# Patient Record
Sex: Female | Born: 1945
Health system: Southern US, Community
[De-identification: ages and names within clinical notes are randomized; demographics above are authoritative.]

## PROBLEM LIST (undated history)

## (undated) DIAGNOSIS — H269 Unspecified cataract: Secondary | ICD-10-CM

## (undated) DIAGNOSIS — E785 Hyperlipidemia, unspecified: Secondary | ICD-10-CM

## (undated) DIAGNOSIS — R739 Hyperglycemia, unspecified: Secondary | ICD-10-CM

## (undated) DIAGNOSIS — K589 Irritable bowel syndrome without diarrhea: Secondary | ICD-10-CM

## (undated) DIAGNOSIS — R42 Dizziness and giddiness: Secondary | ICD-10-CM

## (undated) DIAGNOSIS — K5792 Diverticulitis of intestine, part unspecified, without perforation or abscess without bleeding: Secondary | ICD-10-CM

## (undated) DIAGNOSIS — H9191 Unspecified hearing loss, right ear: Secondary | ICD-10-CM

## (undated) DIAGNOSIS — M1990 Unspecified osteoarthritis, unspecified site: Secondary | ICD-10-CM

## (undated) DIAGNOSIS — G459 Transient cerebral ischemic attack, unspecified: Secondary | ICD-10-CM

## (undated) DIAGNOSIS — I480 Paroxysmal atrial fibrillation: Secondary | ICD-10-CM

## (undated) HISTORY — DX: Diverticulitis of intestine, part unspecified, without perforation or abscess without bleeding: K57.92

## (undated) HISTORY — PX: MIDDLE EAR SURGERY: SHX713

## (undated) HISTORY — DX: Hyperglycemia, unspecified: R73.9

## (undated) HISTORY — PX: BREAST CYST ASPIRATION: SHX578

## (undated) HISTORY — DX: Irritable bowel syndrome, unspecified: K58.9

## (undated) HISTORY — DX: Hyperlipidemia, unspecified: E78.5

## (undated) HISTORY — DX: Unspecified hearing loss, right ear: H91.91

## (undated) HISTORY — DX: Unspecified osteoarthritis, unspecified site: M19.90

## (undated) HISTORY — DX: Transient cerebral ischemic attack, unspecified: G45.9

## (undated) HISTORY — PX: CHOLECYSTECTOMY: SHX55

## (undated) HISTORY — PX: TONSILLECTOMY: SUR1361

## (undated) HISTORY — DX: Unspecified cataract: H26.9

## (undated) HISTORY — DX: Dizziness and giddiness: R42

## (undated) HISTORY — DX: Paroxysmal atrial fibrillation: I48.0

## (undated) HISTORY — PX: BREAST BIOPSY: SHX20

## (undated) HISTORY — PX: APPENDECTOMY: SHX54

---

## 2011-09-27 ENCOUNTER — Encounter (HOSPITAL_BASED_OUTPATIENT_CLINIC_OR_DEPARTMENT_OTHER): Payer: Self-pay | Admitting: Emergency Medicine

## 2011-09-27 ENCOUNTER — Emergency Department (HOSPITAL_BASED_OUTPATIENT_CLINIC_OR_DEPARTMENT_OTHER)
Admission: EM | Admit: 2011-09-27 | Discharge: 2011-09-27 | Disposition: A | Discharge status: Home / Self Care | Payer: BC Managed Care – PPO | Attending: Emergency Medicine | Admitting: Emergency Medicine

## 2011-09-27 ENCOUNTER — Emergency Department (INDEPENDENT_AMBULATORY_CARE_PROVIDER_SITE_OTHER): Payer: BC Managed Care – PPO

## 2011-09-27 DIAGNOSIS — R197 Diarrhea, unspecified: Secondary | ICD-10-CM

## 2011-09-27 DIAGNOSIS — R109 Unspecified abdominal pain: Secondary | ICD-10-CM | POA: Insufficient documentation

## 2011-09-27 DIAGNOSIS — N2889 Other specified disorders of kidney and ureter: Secondary | ICD-10-CM

## 2011-09-27 DIAGNOSIS — R935 Abnormal findings on diagnostic imaging of other abdominal regions, including retroperitoneum: Secondary | ICD-10-CM

## 2011-09-27 DIAGNOSIS — R110 Nausea: Secondary | ICD-10-CM | POA: Insufficient documentation

## 2011-09-27 DIAGNOSIS — K5732 Diverticulitis of large intestine without perforation or abscess without bleeding: Secondary | ICD-10-CM | POA: Insufficient documentation

## 2011-09-27 DIAGNOSIS — K5792 Diverticulitis of intestine, part unspecified, without perforation or abscess without bleeding: Secondary | ICD-10-CM

## 2011-09-27 LAB — COMPREHENSIVE METABOLIC PANEL
BUN: 15 mg/dL (ref 6–23)
CO2: 24 mEq/L (ref 19–32)
Calcium: 10 mg/dL (ref 8.4–10.5)
Chloride: 103 mEq/L (ref 96–112)
Creatinine, Ser: 0.7 mg/dL (ref 0.50–1.10)
GFR calc Af Amer: >90 mL/min (ref >90)
GFR calc non Af Amer: 90 mL/min — ABNORMAL LOW (ref >90)
Glucose, Bld: 99 mg/dL (ref 70–99)
Total Bilirubin: 0.6 mg/dL (ref 0.3–1.2)

## 2011-09-27 LAB — URINALYSIS, ROUTINE W REFLEX MICROSCOPIC
Glucose, UA: NEGATIVE mg/dL
Ketones, ur: NEGATIVE mg/dL
Protein, ur: NEGATIVE mg/dL
pH: 5 (ref 5.0–8.0)

## 2011-09-27 LAB — DIFFERENTIAL
Basophils Absolute: 0 10*3/uL (ref 0.0–0.1)
Eosinophils Relative: 1 % (ref 0–5)
Lymphocytes Relative: 20 % (ref 12–46)
Monocytes Absolute: 1.7 10*3/uL — ABNORMAL HIGH (ref 0.1–1.0)
Monocytes Relative: 15 % — ABNORMAL HIGH (ref 3–12)
Neutro Abs: 7.3 10*3/uL (ref 1.7–7.7)

## 2011-09-27 LAB — CBC
HCT: 42.8 % (ref 36.0–46.0)
Hemoglobin: 14.7 g/dL (ref 12.0–15.0)
MCV: 90.5 fL (ref 78.0–100.0)
RDW: 13 % (ref 11.5–15.5)
WBC: 11.4 10*3/uL — ABNORMAL HIGH (ref 4.0–10.5)

## 2011-09-27 LAB — URINE MICROSCOPIC-ADD ON

## 2011-09-27 MED ORDER — CIPROFLOXACIN HCL 500 MG PO TABS: 500.0000 mg | ORAL_TABLET | Freq: Two times a day (BID) | ORAL | Status: AC

## 2011-09-27 MED ORDER — CIPROFLOXACIN IN D5W 400 MG/200ML IV SOLN
400.0000 mg | Freq: Once | INTRAVENOUS | Status: AC
  Administered 2011-09-27: 400 mg via INTRAVENOUS
  Filled 2011-09-27: qty 200

## 2011-09-27 MED ORDER — ONDANSETRON HCL 4 MG/2ML IJ SOLN
4.0000 mg | Freq: Once | INTRAMUSCULAR | Status: AC
  Administered 2011-09-27: 4 mg via INTRAVENOUS
  Filled 2011-09-27: qty 2

## 2011-09-27 MED ORDER — IOHEXOL 300 MG/ML  SOLN
100.0000 mL | Freq: Once | INTRAMUSCULAR | Status: AC | PRN
  Administered 2011-09-27: 100 mL via INTRAVENOUS

## 2011-09-27 MED ORDER — METRONIDAZOLE IN NACL 5-0.79 MG/ML-% IV SOLN
500.0000 mg | Freq: Once | INTRAVENOUS | Status: AC
  Administered 2011-09-27: 500 mg via INTRAVENOUS
  Filled 2011-09-27: qty 100

## 2011-09-27 MED ORDER — HYDROCODONE-ACETAMINOPHEN 5-500 MG PO TABS: 1.0000 | ORAL_TABLET | Freq: Four times a day (QID) | ORAL | Status: AC | PRN

## 2011-09-27 MED ORDER — MORPHINE SULFATE 4 MG/ML IJ SOLN
4.0000 mg | Freq: Once | INTRAMUSCULAR | Status: AC
  Administered 2011-09-27: 4 mg via INTRAVENOUS
  Filled 2011-09-27: qty 1

## 2011-09-27 MED ORDER — IOHEXOL 300 MG/ML  SOLN
20.0000 mL | INTRAMUSCULAR | Status: AC
  Administered 2011-09-27 (×2): 20 mL via ORAL

## 2011-09-27 MED ORDER — METRONIDAZOLE 500 MG PO TABS: 500.0000 mg | ORAL_TABLET | Freq: Two times a day (BID) | ORAL | Status: AC

## 2011-09-27 MED ORDER — SODIUM CHLORIDE 0.9 % IV SOLN
999.0000 mL | INTRAVENOUS | Status: DC
  Administered 2011-09-27: 999 mL via INTRAVENOUS

## 2011-09-27 MED ORDER — SODIUM CHLORIDE 0.9 % IV SOLN
Freq: Once | INTRAVENOUS | Status: AC
  Administered 2011-09-27: 1000 mL via INTRAVENOUS

## 2011-09-27 NOTE — ED Notes (Signed)
Pt c/o BLQ pain, nausea, some diarrhea x 1 wk; has been on a bland diet this week, but no improvement

## 2011-09-27 NOTE — ED Provider Notes (Signed)
History     CSN: [id number]  Arrival date & time 09/27/11  1114   First MD Initiated Contact with Patient 09/27/11 1215      Chief Complaint  Patient presents with  . Abdominal Pain    (Consider location/radiation/quality/duration/timing/severity/associated sxs/prior treatment) Patient is a 66 y.o. female presenting with abdominal pain. The history is provided by the patient.  Abdominal Pain The primary symptoms of the illness include abdominal pain, nausea and diarrhea. The primary symptoms of the illness do not include fever, vomiting or dysuria. The current episode started more than 2 days ago. The onset of the illness was sudden. The problem has not changed since onset. The patient states that she believes she is currently not pregnant. Risk factors for an acute abdominal problem include being elderly. Symptoms associated with the illness do not include constipation or urgency.    History reviewed. No pertinent past medical history.  Past Surgical History  Procedure Date  . Cholecystectomy   . Appendectomy   . Tonsillectomy   . Middle ear surgery     No family history on file.  History  Substance Use Topics  . Smoking status: Former Games developer  . Smokeless tobacco: Not on file  . Alcohol Use: Yes     social    OB History    Grav Para Term Preterm Abortions TAB SAB Ect Mult Living                  Review of Systems  Constitutional: Negative for fever.  Gastrointestinal: Positive for nausea, abdominal pain and diarrhea. Negative for vomiting and constipation.  Genitourinary: Negative for dysuria and urgency.  All other systems reviewed and are negative.    Allergies  Review of patient's allergies indicates no known allergies.  Home Medications   Current Outpatient Rx  Name Route Sig Dispense Refill  . NORCO PO Oral Take by mouth as needed.      BP 124/86  Pulse 100  Temp(Src) 98.1 F (36.7 C) (Oral)  Resp 18  Ht 5\' 10"  (1.778 m)  Wt 180 lb (81.647  kg)  BMI 25.83 kg/m2  SpO2 98%  Physical Exam  Nursing note and vitals reviewed. Constitutional: She is oriented to person, place, and time. She appears well-developed and well-nourished.  HENT:  Head: Normocephalic and atraumatic.  Eyes: Conjunctivae and EOM are normal.  Neck: Neck supple.  Cardiovascular: Normal rate and regular rhythm.   Pulmonary/Chest: Effort normal and breath sounds normal.  Abdominal: Soft. Bowel sounds are normal.       llq tenderness  Musculoskeletal: Normal range of motion.  Neurological: She is alert and oriented to person, place, and time.  Skin: Skin is warm and dry.  Psychiatric: She has a normal mood and affect.    ED Course  Procedures (including critical care time)  Labs Reviewed  URINALYSIS, ROUTINE W REFLEX MICROSCOPIC - Abnormal; Notable for the following:    Color, Urine AMBER (*) BIOCHEMICALS MAY BE AFFECTED BY COLOR   APPearance CLOUDY (*)    Hgb urine dipstick TRACE (*)    Leukocytes, UA SMALL (*)    All other components within normal limits  CBC - Abnormal; Notable for the following:    WBC 11.4 (*)    All other components within normal limits  DIFFERENTIAL - Abnormal; Notable for the following:    Monocytes Relative 15 (*)    Monocytes Absolute 1.7 (*)    All other components within normal limits  COMPREHENSIVE METABOLIC PANEL -  Abnormal; Notable for the following:    GFR calc non Af Amer 90 (*)    All other components within normal limits  URINE MICROSCOPIC-ADD ON - Abnormal; Notable for the following:    Squamous Epithelial / LPF FEW (*) LESS THAN 10 mL OF URINE SUBMITTED   Bacteria, UA MANY (*)    All other components within normal limits   Ct Abdomen Pelvis W Contrast  09/27/2011  *RADIOLOGY REPORT*  Clinical Data: Bilateral lower abdominal pain with diarrhea for 1 week.  Prior cholecystectomy and appendectomy.  CT ABDOMEN AND PELVIS WITH CONTRAST  Technique:  Multidetector CT imaging of the abdomen and pelvis was performed  following the standard protocol during bolus administration of intravenous contrast.  Contrast: OMNIPAQUE IOHEXOL 300 MG/ML IV SOLN  Comparison: None.  Findings: Scarring at the right lung base. Normal heart size without pericardial or pleural effusion.  Mild hepatic steatosis. Normal spleen, stomach.  Mildly fatty replaced pancreas. Cholecystectomy without biliary ductal dilatation.  Normal adrenal glands and left kidney.  Minimal caliectasis with prominence of the right renal pelvis.  No hydroureter.  Small retroperitoneal nodes, without adenopathy.  Sigmoid diverticulosis.  Focal wall thickening of and edema surrounding the proximal sigmoid on image 71.  No drainable abscess or free perforation.  Diverticula identified throughout remainder of the colon.  Normal terminal ileum. Normal small bowel without abdominal ascites.    No pelvic adenopathy.  Small nodes in the sigmoid mesocolon, likely reactive.  Normal urinary bladder and uterus, without adnexal mass or significant free pelvic fluid. No acute osseous abnormality.  IMPRESSION:  1.  Findings most consistent with uncomplicated proximal sigmoid diverticulitis.  Given the focality of wall thickening, clinical follow-up is suggested to exclude much less likely superinfection of a carcinoma. 2.  Right-sided caliectasis with prominence of the right renal pelvis.  Favor a low grade chronic ureteropelvic junction obstruction. 3.  Mild hepatic steatosis.  Original Report Authenticated By: Consuello Bossier, M.D.     1. Diverticulitis       MDM  Pt treated here with first dose of antibiotics:no sign of abscess:pt is afebrile and tolerating po:pt is okay to go home        Teressa Lower, NP 09/27/11 2048

## 2011-09-28 NOTE — ED Provider Notes (Signed)
Medical screening examination/treatment/procedure(s) were performed by non-physician practitioner and as supervising physician I was immediately available for consultation/collaboration.  Baley K Linker, MD 09/28/11 0739 

## 2012-01-27 DIAGNOSIS — H908 Mixed conductive and sensorineural hearing loss, unspecified: Secondary | ICD-10-CM | POA: Insufficient documentation

## 2012-08-20 DIAGNOSIS — H6120 Impacted cerumen, unspecified ear: Secondary | ICD-10-CM | POA: Diagnosis not present

## 2012-08-20 DIAGNOSIS — J309 Allergic rhinitis, unspecified: Secondary | ICD-10-CM | POA: Diagnosis not present

## 2012-08-20 DIAGNOSIS — H6690 Otitis media, unspecified, unspecified ear: Secondary | ICD-10-CM | POA: Diagnosis not present

## 2012-08-20 DIAGNOSIS — Z9889 Other specified postprocedural states: Secondary | ICD-10-CM | POA: Diagnosis not present

## 2012-12-20 DIAGNOSIS — L821 Other seborrheic keratosis: Secondary | ICD-10-CM | POA: Diagnosis not present

## 2012-12-20 DIAGNOSIS — L723 Sebaceous cyst: Secondary | ICD-10-CM | POA: Diagnosis not present

## 2012-12-20 DIAGNOSIS — L909 Atrophic disorder of skin, unspecified: Secondary | ICD-10-CM | POA: Diagnosis not present

## 2012-12-20 DIAGNOSIS — D485 Neoplasm of uncertain behavior of skin: Secondary | ICD-10-CM | POA: Diagnosis not present

## 2012-12-20 DIAGNOSIS — Z85828 Personal history of other malignant neoplasm of skin: Secondary | ICD-10-CM | POA: Diagnosis not present

## 2012-12-20 DIAGNOSIS — L919 Hypertrophic disorder of the skin, unspecified: Secondary | ICD-10-CM | POA: Diagnosis not present

## 2012-12-20 DIAGNOSIS — L570 Actinic keratosis: Secondary | ICD-10-CM | POA: Diagnosis not present

## 2012-12-20 DIAGNOSIS — D1801 Hemangioma of skin and subcutaneous tissue: Secondary | ICD-10-CM | POA: Diagnosis not present

## 2013-02-16 ENCOUNTER — Ambulatory Visit (INDEPENDENT_AMBULATORY_CARE_PROVIDER_SITE_OTHER): Payer: Medicare Other | Admitting: Internal Medicine

## 2013-02-16 ENCOUNTER — Encounter: Payer: Self-pay | Admitting: Internal Medicine

## 2013-02-16 VITALS — Ht 70.0 in | Wt 191.0 lb

## 2013-02-16 DIAGNOSIS — F439 Reaction to severe stress, unspecified: Secondary | ICD-10-CM

## 2013-02-16 DIAGNOSIS — E785 Hyperlipidemia, unspecified: Secondary | ICD-10-CM | POA: Diagnosis not present

## 2013-02-16 DIAGNOSIS — K3189 Other diseases of stomach and duodenum: Secondary | ICD-10-CM

## 2013-02-16 DIAGNOSIS — Z780 Asymptomatic menopausal state: Secondary | ICD-10-CM

## 2013-02-16 DIAGNOSIS — R32 Unspecified urinary incontinence: Secondary | ICD-10-CM

## 2013-02-16 DIAGNOSIS — R1013 Epigastric pain: Secondary | ICD-10-CM

## 2013-02-16 DIAGNOSIS — G4700 Insomnia, unspecified: Secondary | ICD-10-CM

## 2013-02-16 DIAGNOSIS — Z139 Encounter for screening, unspecified: Secondary | ICD-10-CM | POA: Diagnosis not present

## 2013-02-16 DIAGNOSIS — N951 Menopausal and female climacteric states: Secondary | ICD-10-CM

## 2013-02-16 DIAGNOSIS — Z733 Stress, not elsewhere classified: Secondary | ICD-10-CM

## 2013-02-16 LAB — LIPID PANEL
Cholesterol: 264 mg/dL — ABNORMAL HIGH (ref 0–200)
LDL Cholesterol: 168 mg/dL — ABNORMAL HIGH (ref 0–99)
Total CHOL/HDL Ratio: 6.3 Ratio
Triglycerides: 270 mg/dL — ABNORMAL HIGH (ref <150)
VLDL: 54 mg/dL — ABNORMAL HIGH (ref 0–40)

## 2013-02-16 LAB — CBC WITH DIFFERENTIAL/PLATELET
Basophils Relative: 1 % (ref 0–1)
Eosinophils Absolute: 0.1 10*3/uL (ref 0.0–0.7)
Eosinophils Relative: 1 % (ref 0–5)
Lymphs Abs: 2.5 10*3/uL (ref 0.7–4.0)
MCH: 31.2 pg (ref 26.0–34.0)
MCHC: 34.5 g/dL (ref 30.0–36.0)
MCV: 90.6 fL (ref 78.0–100.0)
Neutrophils Relative %: 59 % (ref 43–77)
Platelets: 372 10*3/uL (ref 150–400)
RBC: 4.87 MIL/uL (ref 3.87–5.11)
RDW: 13.5 % (ref 11.5–15.5)

## 2013-02-16 LAB — COMPREHENSIVE METABOLIC PANEL
ALT: 25 U/L (ref 0–35)
Alkaline Phosphatase: 73 U/L (ref 39–117)
CO2: 27 mEq/L (ref 19–32)
Creat: 0.84 mg/dL (ref 0.50–1.10)
Total Bilirubin: 0.5 mg/dL (ref 0.3–1.2)

## 2013-02-16 NOTE — Progress Notes (Signed)
  Subjective:    Patient ID: Kelly Burns, female    DOB: [birth year], 67 y.o.   MRN: [id number]  HPI Kelly Burns is a new pt here for first visit to establish care.  Kelly Burns is very tearful as her husband is in advanced stages of pyriform sinus cancer and she tells me "there is nothing more to be done"  She has children close by,  She does not like to ask for help.  Has insomnia at times,  Takes melatonin and does not like to take meds in general  She has not seen a primary MD in a while and "needs to get caught up"  History of hyperlipdemia ,  Occasional dyspepsia relieved by [employer].  She sees Dr. Matthias Hughs  No Known Allergies Past Medical History  Diagnosis Date  . Diverticulitis    Past Surgical History  Procedure Laterality Date  . Cholecystectomy    . Appendectomy    . Tonsillectomy    . Middle ear surgery     History   Social History  . Marital Status: Married    Spouse Name: N/A    Number of Children: N/A  . Years of Education: N/A   Occupational History  . Not on file.   Social History Main Topics  . Smoking status: Never Smoker   . Smokeless tobacco: Not on file  . Alcohol Use: Yes     Comment: social  . Drug Use: No  . Sexually Active: No   Other Topics Concern  . Not on file   Social History Narrative  . No narrative on file   Family History  Problem Relation Age of Onset  . Stroke Mother   . Dementia Father   . Cancer Maternal Aunt     colon  . Cancer Maternal Grandmother     breast   Patient Active Problem List   Diagnosis Date Noted  . Menopause 02/16/2013  . Urinary incontinence 02/16/2013  . Hyperlipidemia 02/16/2013  . Situational stress 02/16/2013  . Insomnia 02/16/2013   No current outpatient prescriptions on file prior to visit.   No current facility-administered medications on file prior to visit.       Review of Systems See HPI    Objective:   Physical Exam Physical Exam  Nursing note and vitals reviewed.   Constitutional: She is oriented to person, place, and time. She appears well-developed and well-nourished.  HENT:  Head: Normocephalic and atraumatic.  Cardiovascular: Normal rate and regular rhythm. Exam reveals no gallop and no friction rub.  No murmur heard.  Pulmonary/Chest: Breath sounds normal. She has no wheezes. She has no rales.  Neurological: She is alert and oriented to person, place, and time.  Skin: Skin is warm and dry.  Psychiatric: She has a normal mood and affect. Her behavior is normal.              Assessment & Plan:  Situational stress  Pt does not wish talking therapy at this time.  Advised to gather support and not to be afraid of asking for respite when needed.    Insomnia related to above.  Using Melatonin now and OK to have Ativan if she needs it.  Pt is to call   History of hyperlipidemia  Will check fasting labs  History of urinary incontinence.    Schedule CPE,  Mm today

## 2013-02-18 DIAGNOSIS — Z9889 Other specified postprocedural states: Secondary | ICD-10-CM | POA: Diagnosis not present

## 2013-02-18 DIAGNOSIS — H60509 Unspecified acute noninfective otitis externa, unspecified ear: Secondary | ICD-10-CM | POA: Diagnosis not present

## 2013-02-18 DIAGNOSIS — H6120 Impacted cerumen, unspecified ear: Secondary | ICD-10-CM | POA: Diagnosis not present

## 2013-02-18 DIAGNOSIS — H908 Mixed conductive and sensorineural hearing loss, unspecified: Secondary | ICD-10-CM | POA: Diagnosis not present

## 2013-02-18 DIAGNOSIS — H6980 Other specified disorders of Eustachian tube, unspecified ear: Secondary | ICD-10-CM | POA: Diagnosis not present

## 2013-02-21 ENCOUNTER — Telehealth: Payer: Self-pay | Admitting: *Deleted

## 2013-02-21 NOTE — Telephone Encounter (Signed)
Lab results mailed to pt home address

## 2013-03-02 ENCOUNTER — Ambulatory Visit (HOSPITAL_BASED_OUTPATIENT_CLINIC_OR_DEPARTMENT_OTHER)
Admission: RE | Admit: 2013-03-02 | Discharge: 2013-03-02 | Disposition: A | Discharge status: Home / Self Care | Payer: Medicare Other | Source: Ambulatory Visit | Attending: Internal Medicine | Admitting: Internal Medicine

## 2013-03-02 DIAGNOSIS — Z1231 Encounter for screening mammogram for malignant neoplasm of breast: Secondary | ICD-10-CM | POA: Diagnosis not present

## 2013-03-08 ENCOUNTER — Other Ambulatory Visit: Payer: Self-pay | Admitting: Internal Medicine

## 2013-03-08 DIAGNOSIS — R928 Other abnormal and inconclusive findings on diagnostic imaging of breast: Secondary | ICD-10-CM

## 2013-03-10 ENCOUNTER — Other Ambulatory Visit: Payer: Self-pay

## 2013-03-10 ENCOUNTER — Other Ambulatory Visit: Payer: Self-pay | Admitting: Internal Medicine

## 2013-03-10 DIAGNOSIS — R928 Other abnormal and inconclusive findings on diagnostic imaging of breast: Secondary | ICD-10-CM

## 2013-03-17 ENCOUNTER — Ambulatory Visit
Admission: RE | Admit: 2013-03-17 | Discharge: 2013-03-17 | Disposition: A | Discharge status: Home / Self Care | Payer: Medicare Other | Source: Ambulatory Visit | Attending: Internal Medicine | Admitting: Internal Medicine

## 2013-03-17 DIAGNOSIS — R928 Other abnormal and inconclusive findings on diagnostic imaging of breast: Secondary | ICD-10-CM

## 2013-03-17 DIAGNOSIS — N6019 Diffuse cystic mastopathy of unspecified breast: Secondary | ICD-10-CM | POA: Diagnosis not present

## 2013-03-21 ENCOUNTER — Telehealth: Payer: Self-pay | Admitting: *Deleted

## 2013-03-21 NOTE — Telephone Encounter (Signed)
Left message with pt's SO to return call awaiting return call

## 2013-03-21 NOTE — Telephone Encounter (Signed)
Message copied by Mathews Robinsons on Mon Mar 21, 2013  3:41 PM ------      Message from: Raechel Chute D      Created: Thu Mar 17, 2013  4:55 PM       [facility]            Call pt and let her know that I have seen her mammogram and ultrasound report.  Ultrasound shows she has cysts in her breast no solid mass.  She should repeat her mammogram in one year ------

## 2013-03-21 NOTE — Telephone Encounter (Signed)
Message copied by Mathews Robinsons on Mon Mar 21, 2013  3:12 PM ------      Message from: Raechel Chute D      Created: Thu Mar 17, 2013  4:55 PM       [facility]            Call pt and let her know that I have seen her mammogram and ultrasound report.  Ultrasound shows she has cysts in her breast no solid mass.  She should repeat her mammogram in one year ------

## 2013-05-11 ENCOUNTER — Telehealth: Payer: Self-pay | Admitting: *Deleted

## 2013-05-11 ENCOUNTER — Encounter: Payer: Self-pay | Admitting: Internal Medicine

## 2013-05-11 ENCOUNTER — Ambulatory Visit (INDEPENDENT_AMBULATORY_CARE_PROVIDER_SITE_OTHER): Payer: Medicare Other | Admitting: Internal Medicine

## 2013-05-11 ENCOUNTER — Other Ambulatory Visit (HOSPITAL_COMMUNITY)
Admission: RE | Admit: 2013-05-11 | Discharge: 2013-05-11 | Disposition: A | Discharge status: Home / Self Care | Payer: Medicare Other | Source: Ambulatory Visit | Attending: Internal Medicine | Admitting: Internal Medicine

## 2013-05-11 VITALS — BP 108/72 | HR 73 | Temp 97.3°F | Resp 18 | Wt 189.0 lb

## 2013-05-11 DIAGNOSIS — H7191 Unspecified cholesteatoma, right ear: Secondary | ICD-10-CM

## 2013-05-11 DIAGNOSIS — M7502 Adhesive capsulitis of left shoulder: Secondary | ICD-10-CM | POA: Insufficient documentation

## 2013-05-11 DIAGNOSIS — E2839 Other primary ovarian failure: Secondary | ICD-10-CM

## 2013-05-11 DIAGNOSIS — Z23 Encounter for immunization: Secondary | ICD-10-CM

## 2013-05-11 DIAGNOSIS — F439 Reaction to severe stress, unspecified: Secondary | ICD-10-CM

## 2013-05-11 DIAGNOSIS — Z1151 Encounter for screening for human papillomavirus (HPV): Secondary | ICD-10-CM | POA: Diagnosis not present

## 2013-05-11 DIAGNOSIS — Z139 Encounter for screening, unspecified: Secondary | ICD-10-CM

## 2013-05-11 DIAGNOSIS — N393 Stress incontinence (female) (male): Secondary | ICD-10-CM | POA: Diagnosis not present

## 2013-05-11 DIAGNOSIS — N6001 Solitary cyst of right breast: Secondary | ICD-10-CM | POA: Insufficient documentation

## 2013-05-11 DIAGNOSIS — Z01419 Encounter for gynecological examination (general) (routine) without abnormal findings: Secondary | ICD-10-CM

## 2013-05-11 DIAGNOSIS — Z124 Encounter for screening for malignant neoplasm of cervix: Secondary | ICD-10-CM | POA: Insufficient documentation

## 2013-05-11 DIAGNOSIS — Z9189 Other specified personal risk factors, not elsewhere classified: Secondary | ICD-10-CM

## 2013-05-11 DIAGNOSIS — Z780 Asymptomatic menopausal state: Secondary | ICD-10-CM

## 2013-05-11 DIAGNOSIS — Z0000 Encounter for general adult medical examination without abnormal findings: Secondary | ICD-10-CM | POA: Diagnosis not present

## 2013-05-11 DIAGNOSIS — R42 Dizziness and giddiness: Secondary | ICD-10-CM

## 2013-05-11 DIAGNOSIS — Z733 Stress, not elsewhere classified: Secondary | ICD-10-CM | POA: Diagnosis not present

## 2013-05-11 DIAGNOSIS — E785 Hyperlipidemia, unspecified: Secondary | ICD-10-CM

## 2013-05-11 HISTORY — DX: Adhesive capsulitis of left shoulder: M75.02

## 2013-05-11 HISTORY — DX: Solitary cyst of right breast: N60.01

## 2013-05-11 LAB — POCT URINALYSIS DIPSTICK
Ketones, UA: NEGATIVE
Protein, UA: NEGATIVE
Spec Grav, UA: 1.01
pH, UA: 6

## 2013-05-11 MED ORDER — LORAZEPAM 0.5 MG PO TABS: ORAL_TABLET | ORAL | Status: DC

## 2013-05-11 NOTE — Telephone Encounter (Signed)
Patient notified while in office today of bone scan appt for 05/23/13 at 245.

## 2013-05-11 NOTE — Progress Notes (Signed)
Subjective:    Patient ID: Kelly Burns, female    DOB: [birth year], 67 y.o.   MRN: [id number]  HPI  Kelly Burns is here for CPE.  She is teary as her husband has advanced pyriform sinus carcinoma and hospice is involved now.  She did take him to the beach.    Kelly Burns reports she has had  A cholesteatoma removed from her R ear.  She had decreased hearing and chronic vertigo.  She does not like to take meds for this.  She does have an upcoming appt with Dr. Joelene Millin at [facility] for this.   Apparently Dr. Joelene Millin does not like to give Meclizine  See lipids.  She has not been on anything for this in the past  See mm she has area of cysts in R breasts  FH breast cancer in [place].  U/S shows clustered cysts at 6 o'clock and recommended yearly screening  She is not sleeping well and would like some Ativan  No Known Allergies Past Medical History  Diagnosis Date  . Diverticulitis    Past Surgical History  Procedure Laterality Date  . Cholecystectomy    . Appendectomy    . Tonsillectomy    . Middle ear surgery     History   Social History  . Marital Status: Married    Spouse Name: N/A    Number of Children: N/A  . Years of Education: N/A   Occupational History  . Not on file.   Social History Main Topics  . Smoking status: Never Smoker   . Smokeless tobacco: Not on file  . Alcohol Use: Yes     Comment: social  . Drug Use: No  . Sexual Activity: No   Other Topics Concern  . Not on file   Social History Narrative  . No narrative on file   Family History  Problem Relation Age of Onset  . Stroke Mother   . Dementia Father   . Cancer Maternal Aunt     colon  . Cancer Maternal Grandmother     breast   Patient Active Problem List   Diagnosis Date Noted  . Menopause 02/16/2013  . Urinary incontinence 02/16/2013  . Hyperlipidemia 02/16/2013  . Situational stress 02/16/2013  . Insomnia 02/16/2013  . Dyspepsia 02/16/2013   Current Outpatient Prescriptions on File Prior to Visit   Medication Sig Dispense Refill  . Melatonin 3 MG TABS Take 1 tablet by mouth daily.      . Probiotic Product (ALIGN PO) Take 1 tablet by mouth.      Marland Kitchen VIT B6-VIT B12-OMEGA 3 ACIDS PO Take 1 tablet by mouth daily.       No current facility-administered medications on file prior to visit.      Review of Systems    see HPI  Objective:   Physical Exam Physical Exam  Vital signs and nursing note reviewed  Constitutional: She is oriented to person, place, and time. She appears well-developed and well-nourished. She is cooperative.  HENT:  Head: Normocephalic and atraumatic.  Right Ear: Tympanic membrane normal.  Left Ear: Tympanic membrane normal.  Nose: Nose normal.  Mouth/Throat: Oropharynx is clear and moist and mucous membranes are normal. No oropharyngeal exudate or posterior oropharyngeal erythema.  Eyes: Conjunctivae and EOM are normal. Pupils are equal, round, and reactive to light.  Neck: Neck supple. No JVD present. Carotid bruit is not present. No mass and no thyromegaly present.  Cardiovascular: Regular rhythm, normal heart sounds, intact distal pulses and normal  pulses.  Exam reveals no gallop and no friction rub.   No murmur heard. Pulses:      Dorsalis pedis pulses are 2+ on the right side, and 2+ on the left side.  Pulmonary/Chest: Breath sounds normal. She has no wheezes. She has no rhonchi. She has no rales. Right breast exhibits easily mobile mass  9 o'clock near nipple very tender on exam, no nipple discharge and no skin change. Left breast exhibits no mass, no nipple discharge and no skin change.  Abdominal: Soft. Bowel sounds are normal. She exhibits no distension and no mass. There is no hepatosplenomegaly. There is no tenderness. There is no CVA tenderness.  Genitourinary: Rectum normal, vagina normal and uterus normal. Rectal exam shows no mass. Guaiac negative stool. No labial fusion. There is no lesion on the right labia. There is no lesion on the left labia.  Cervix exhibits no motion tenderness. Right adnexum displays no mass, no tenderness and no fullness. Left adnexum displays no mass, no tenderness and no fullness. No erythema around the vagina.  Musculoskeletal:       No active synovitis to any joint.    Lymphadenopathy:       Right cervical: No superficial cervical adenopathy present.      Left cervical: No superficial cervical adenopathy present.       Right axillary: No pectoral and no lateral adenopathy present.       Left axillary: No pectoral and no lateral adenopathy present.      Right: No inguinal adenopathy present.       Left: No inguinal adenopathy present.  Neurological: She is alert and oriented to person, place, and time. She has normal strength and normal reflexes. No cranial nerve deficit or sensory deficit. She displays a negative Romberg sign. Coordination and gait normal.  Skin: Skin is warm and dry. No abrasion, no bruising, no ecchymosis and no rash noted. No cyanosis. Nails show no clubbing.  Psychiatric: She has a normal mood and affect. Her speech is normal and behavior is normal.          Assessment & Plan:  Helath Maintenance  Will give pneumovax today.   Advised influenza and Tdap next visit.  Schedule bone density.  Advised to see Dr. Matthias Hughs for her colonoscopy as she has never had one ( she has put this off to care for her husband) pap today  Hyperlipidemia  Will begin lipitor 3 times per week  She is to see me in 4 weeks  Breast mass  Appears cystic on ultrasound but may have new mass near nipple.  Will bring back in a few weeks for re-examination of breast  Situational stress/insomnia  OK for Ativan 0.5 mg 3 times a week for sleep  Chronic vertigo/ S/P cholesteatoma surgery on R  She has upcoming appt with [facility]        Assessment & Plan:

## 2013-05-12 ENCOUNTER — Telehealth: Payer: Self-pay | Admitting: *Deleted

## 2013-05-12 NOTE — Telephone Encounter (Signed)
Kelly Burns  Call pt and let her know that on her new pt form I do not see lipitor listed as a medication she was on.    Tell her to have her pharmacy send over the refill request

## 2013-05-12 NOTE — Telephone Encounter (Signed)
Pt states that a rx for lipitor was supposed to be called in but wasn't. I do not see it on her med list to re-order

## 2013-05-17 ENCOUNTER — Telehealth: Payer: Self-pay | Admitting: *Deleted

## 2013-05-17 ENCOUNTER — Encounter: Payer: Self-pay | Admitting: *Deleted

## 2013-05-17 MED ORDER — ATORVASTATIN CALCIUM 10 MG PO TABS: ORAL_TABLET | ORAL | Status: DC

## 2013-05-17 NOTE — Telephone Encounter (Signed)
Pt states that lipitor is a new RX prescribed by Dr Constance Goltz at last visit RX was never prescribed

## 2013-05-17 NOTE — Telephone Encounter (Signed)
Pap letter mailed to home address

## 2013-05-19 ENCOUNTER — Telehealth: Payer: Self-pay | Admitting: *Deleted

## 2013-05-19 NOTE — Telephone Encounter (Signed)
[employer] and let her know that red yeast rice is very close in chemical structure to lipitor and red yeast rice will elevate her liver tests as well as lipitor  She will still need to see me to have her liver blood tests checked if she takes red yeast rice  Red yeast rice is over the counter  I do not typically prescribe this and do not know the dose to tell her.

## 2013-05-19 NOTE — Telephone Encounter (Signed)
Pt states that she wants to try red rice yeast instead of lipitor. Pt wants to know  what dosage to take and, since she is not taking the lipitor does she need to have liver function test in 3 weeks and when should we recheck her cholesterol

## 2013-05-19 NOTE — Telephone Encounter (Signed)
Notified pt regarding the use of red yeast rice

## 2013-05-23 ENCOUNTER — Ambulatory Visit
Admission: RE | Admit: 2013-05-23 | Discharge: 2013-05-23 | Disposition: A | Discharge status: Home / Self Care | Payer: Medicare Other | Source: Ambulatory Visit | Attending: Internal Medicine | Admitting: Internal Medicine

## 2013-05-23 ENCOUNTER — Telehealth: Payer: Self-pay | Admitting: Internal Medicine

## 2013-05-23 DIAGNOSIS — E2839 Other primary ovarian failure: Secondary | ICD-10-CM

## 2013-05-23 DIAGNOSIS — M899 Disorder of bone, unspecified: Secondary | ICD-10-CM | POA: Diagnosis not present

## 2013-05-23 NOTE — Telephone Encounter (Signed)
Spoke with pt and discussed mm report and palpable mass in R breast   She is tearful on the phone stating   " I have had a rough morning"  She did not want to elaborate on the phone  Will move up her appt with me

## 2013-05-30 ENCOUNTER — Ambulatory Visit (INDEPENDENT_AMBULATORY_CARE_PROVIDER_SITE_OTHER): Payer: Medicare Other | Admitting: Internal Medicine

## 2013-05-30 ENCOUNTER — Encounter: Payer: Self-pay | Admitting: Internal Medicine

## 2013-05-30 VITALS — BP 116/68 | HR 67 | Temp 98.2°F | Resp 18 | Wt 188.0 lb

## 2013-05-30 DIAGNOSIS — N6009 Solitary cyst of unspecified breast: Secondary | ICD-10-CM

## 2013-05-30 DIAGNOSIS — Z23 Encounter for immunization: Secondary | ICD-10-CM | POA: Diagnosis not present

## 2013-05-30 DIAGNOSIS — N6001 Solitary cyst of right breast: Secondary | ICD-10-CM

## 2013-05-30 DIAGNOSIS — Z803 Family history of malignant neoplasm of breast: Secondary | ICD-10-CM

## 2013-05-30 DIAGNOSIS — E785 Hyperlipidemia, unspecified: Secondary | ICD-10-CM | POA: Diagnosis not present

## 2013-05-30 NOTE — Addendum Note (Signed)
Addended by: Mathews Robinsons on: 05/30/2013 11:59 AM   Modules accepted: Orders

## 2013-05-30 NOTE — Progress Notes (Signed)
Subjective:    Patient ID: Kelly Burns, female    DOB: [birth year], 67 y.o.   MRN: [id number]  HPI  Kelly Burns is here for follow up  See Breast ultrasound.  Cluster of cysts found at 6 oclock.  Pt tender in area  At my last CPE If palpated mass at 11 oclock near nipple.  She has FH of breast ca in maternal grandmother.    I have not received her old chart from Dr. Wende Bushy  I do not have prior mammgrams.   She is dealing with stress of dying husband as best she can.  He had metastatic pyriform sinus cancer and is losing weight  Hospice is involved  She has started red yeast rice 1 tab bid 2 weeks ago  No Known Allergies Past Medical History  Diagnosis Date  . Diverticulitis    Past Surgical History  Procedure Laterality Date  . Cholecystectomy    . Appendectomy    . Tonsillectomy    . Middle ear surgery     History   Social History  . Marital Status: Married    Spouse Name: N/A    Number of Children: N/A  . Years of Education: N/A   Occupational History  . Not on file.   Social History Main Topics  . Smoking status: Never Smoker   . Smokeless tobacco: Not on file  . Alcohol Use: Yes     Comment: social  . Drug Use: No  . Sexual Activity: No   Other Topics Concern  . Not on file   Social History Narrative  . No narrative on file   Family History  Problem Relation Age of Onset  . Stroke Mother   . Dementia Father   . Cancer Maternal Aunt     colon  . Cancer Maternal Grandmother     breast   Patient Active Problem List   Diagnosis Date Noted  . Cyst, breast 05/30/2013  . FH: breast cancer 05/30/2013  . Cholesteatoma of right ear 05/11/2013  . Vertigo 05/11/2013  . Stress incontinence 05/11/2013  . Adhesive capsulitis of left shoulder 05/11/2013  . Cyst of right breast 05/11/2013  . Menopause 02/16/2013  . Hyperlipidemia 02/16/2013  . Situational stress 02/16/2013  . Insomnia 02/16/2013  . Dyspepsia 02/16/2013   Current Outpatient Prescriptions  on File Prior to Visit  Medication Sig Dispense Refill  . LORazepam (ATIVAN) 0.5 MG tablet Take one tablet at hs  3 times per week  20 tablet  1  . Melatonin 3 MG TABS Take 1 tablet by mouth daily.      . Probiotic Product (ALIGN PO) Take 1 tablet by mouth.      Marland Kitchen VIT B6-VIT B12-OMEGA 3 ACIDS PO Take 1 tablet by mouth daily.      Marland Kitchen atorvastatin (LIPITOR) 10 MG tablet Take one tablet mon, Wed, and Friday only  30 tablet  0   No current facility-administered medications on file prior to visit.      Review of Systems    see HPI Objective:   Physical Exam  Physical Exam  Nursing note and vitals reviewed.  Constitutional: She is oriented to person, place, and time. She appears well-developed and well-nourished.  HENT:  Head: Normocephalic and atraumatic.  Cardiovascular: Normal rate and regular rhythm. Exam reveals no gallop and no friction rub.  No murmur heard.  Pulmonary/Chest: Breath sounds normal. She has no wheezes. She has no rales.  Breast R Breast very tender mass near nipple  11 oclock.  Easily mobile  At 6 oclock position very small mobile mass  Barely palpable Neurological: She is alert and oriented to person, place, and time.  Skin: Skin is warm and dry.  Psychiatric: She has a normal mood and affect. Her behavior is normal.            Assessment & Plan:   R breast mass  Multiple masses may be cluster of cysts but will get opinion of Dr. Donell Beers in breast clinic   Given FH of breast cancer in   Hyperlipidemia  She is on red yeast rice.  Counseled of SE profile very similar to statins  Will need lipid/liver in 3 weeks  Flu vaccine today

## 2013-05-30 NOTE — Addendum Note (Signed)
Addended by: Mathews Robinsons on: 05/30/2013 12:05 PM   Modules accepted: Orders

## 2013-05-30 NOTE — Patient Instructions (Addendum)
Take calcium  1200-1500 mg daily with vitamin D  [phone] units daily  Will set up appt with Dr. Donell Beers  Breast surgeon  [address]

## 2013-06-07 ENCOUNTER — Ambulatory Visit (INDEPENDENT_AMBULATORY_CARE_PROVIDER_SITE_OTHER): Payer: Medicare Other | Admitting: General Surgery

## 2013-06-07 ENCOUNTER — Encounter (INDEPENDENT_AMBULATORY_CARE_PROVIDER_SITE_OTHER): Payer: Self-pay | Admitting: General Surgery

## 2013-06-07 VITALS — BP 104/68 | HR 79 | Temp 96.7°F | Ht 70.0 in | Wt 188.4 lb

## 2013-06-07 DIAGNOSIS — N6009 Solitary cyst of unspecified breast: Secondary | ICD-10-CM | POA: Diagnosis not present

## 2013-06-07 DIAGNOSIS — N6001 Solitary cyst of right breast: Secondary | ICD-10-CM

## 2013-06-07 NOTE — Assessment & Plan Note (Signed)
The area at 9 o'clock feels like dense corded breast tissue.  I will repeat exam in 3 months to make sure this still feels similar.    If an area seems to be a palpable mass at next visit, will consider surgical biopsy vs MRI.

## 2013-06-07 NOTE — Progress Notes (Signed)
Chief Complaint  Patient presents with  . New Evaluation    eval Rt br     HISTORY: Pt is a 67 yo F who presents with a concern for a palpable mass at 9 o'clock and a cystic area at 6 o'clock on the right breast.  She has never had a callback on mammograms or a breast biopsy.  Her maternal grandmother had breast cancer in her 48s.  Her maternal aunt had colon cancer.  She had menarche at age 55.  Her first child was at age 83.  She did breast feed 2 children. She has never taken hormonal OCPs or hormone replacement.  She denies breast trauma, breast pain, nipple retraction or skin dimpling.  She cannot palpate a mass herself.    Past Medical History  Diagnosis Date  . Diverticulitis   . Hyperlipidemia     Past Surgical History  Procedure Laterality Date  . Cholecystectomy    . Appendectomy    . Tonsillectomy    . Middle ear surgery      Current Outpatient Prescriptions  Medication Sig Dispense Refill  . Melatonin 3 MG TABS Take 1 tablet by mouth daily.      . Probiotic Product (ALIGN PO) Take 1 tablet by mouth.      . Red Yeast Rice Extract (RED YEAST RICE PO) Take 1 tablet by mouth 2 (two) times daily.      Marland Kitchen VIT B6-VIT B12-OMEGA 3 ACIDS PO Take 1 tablet by mouth daily.      Marland Kitchen LORazepam (ATIVAN) 0.5 MG tablet Take one tablet at hs  3 times per week  20 tablet  1   No current facility-administered medications for this visit.     No Known Allergies   Family History  Problem Relation Age of Onset  . Stroke Mother   . Heart disease Mother   . Dementia Father   . Cancer Maternal Aunt     colon  . Cancer Maternal Grandmother     breast     History   Social History  . Marital Status: Married    Spouse Name: N/A    Number of Children: N/A  . Years of Education: N/A   Social History Main Topics  . Smoking status: Former Smoker    Types: Cigars  . Smokeless tobacco: None  . Alcohol Use: Yes     Comment: social  . Drug Use: No  . Sexual Activity: No   REVIEW  OF SYSTEMS - PERTINENT POSITIVES ONLY: 12 point review of systems negative other than HPI and PMH except for hearing loss  EXAM: Filed Vitals:   06/07/13 1456  BP: 104/68  Pulse: 79  Temp: 96.7 F (35.9 C)   Filed Weights   06/07/13 1456  Weight: 188 lb 6.4 oz (85.458 kg)     Gen:  No acute distress.  Well nourished and well groomed.   Neurological: Alert and oriented to person, place, and time. Coordination normal. mild hearing loss.   Head: Normocephalic and atraumatic.  Eyes: Conjunctivae are normal. Pupils are equal, round, and reactive to light. No scleral icterus.  Neck: Normal range of motion. Neck supple. No tracheal deviation or thyromegaly present.  Cardiovascular: Normal rate, regular rhythm, normal heart sounds and intact distal pulses.  Exam reveals no gallop and no friction rub.  No murmur heard. Respiratory: Effort normal.  No respiratory distress. No chest wall tenderness. Breath sounds normal.  No wheezes, rales or rhonchi.  Breast:  Left  breast sl larger than right.  Minimal to moderate ptosis.  No skin dimpling, no nipple retraction.  No nipple discharge.  Right breast more tender than left.  At 9 oclock, there is a corded area that feels like normal breast tissue.  I cannot palpate a discrete mass.   GI: Soft. Bowel sounds are normal. The abdomen is soft and nontender.  There is no rebound and no guarding.  Musculoskeletal: Normal range of motion. Extremities are nontender.  Lymphadenopathy: No cervical, preauricular, postauricular or axillary adenopathy is present Skin: Skin is warm and dry. No rash noted. No diaphoresis. No erythema. No pallor. No clubbing, cyanosis, or edema.   Psychiatric: Normal mood and affect. Behavior is normal. Judgment and thought content normal.    LABORATORY RESULTS: Available labs are reviewed  n/a  RADIOLOGY RESULTS: See E-Chart or I-Site for most recent results.  Images and reports are reviewed. Mammo/ultrasound Ultrasound is  performed, showing clustered cysts at 6 o'clock 4 cm  from the right nipple measuring 9 x 8 x 4 mm. The patient is  tender in this area.  IMPRESSION:  Right breast cysts.  RECOMMENDATION:  Yearly screening mammography is suggested.    ASSESSMENT AND PLAN: Cyst of right breast The area at 9 o'clock feels like dense corded breast tissue.  I will repeat exam in 3 months to make sure this still feels similar.    If an area seems to be a palpable mass at next visit, will consider surgical biopsy vs MRI.         Maudry Diego MD Surgical Oncology, General and Endocrine Surgery [facility]      Visit Diagnoses: 1. Cyst of right breast     Primary Care Physician: Levon Hedger, MD

## 2013-06-07 NOTE — Patient Instructions (Addendum)
Follow up in 3 months for repeat breast exam.

## 2013-06-08 ENCOUNTER — Ambulatory Visit: Payer: Medicare Other | Admitting: Internal Medicine

## 2013-06-13 ENCOUNTER — Ambulatory Visit (INDEPENDENT_AMBULATORY_CARE_PROVIDER_SITE_OTHER): Payer: Self-pay | Admitting: General Surgery

## 2013-06-20 DIAGNOSIS — E785 Hyperlipidemia, unspecified: Secondary | ICD-10-CM | POA: Diagnosis not present

## 2013-06-21 ENCOUNTER — Telehealth: Payer: Self-pay | Admitting: *Deleted

## 2013-06-21 LAB — LIPID PANEL
HDL: 40 mg/dL (ref >39)
LDL Cholesterol: 164 mg/dL — ABNORMAL HIGH (ref 0–99)
Triglycerides: 238 mg/dL — ABNORMAL HIGH (ref <150)
VLDL: 48 mg/dL — ABNORMAL HIGH (ref 0–40)

## 2013-06-21 LAB — HEPATIC FUNCTION PANEL
Albumin: 4.5 g/dL (ref 3.5–5.2)
Indirect Bilirubin: 0.5 mg/dL (ref 0.0–0.9)
Total Protein: 6.5 g/dL (ref 6.0–8.3)

## 2013-06-21 NOTE — Telephone Encounter (Signed)
Message copied by Mathews Robinsons on Tue Jun 21, 2013  9:18 AM ------      Message from: Raechel Chute D      Created: Tue Jun 21, 2013  7:57 AM       Kelly Burns            Call pt and let her know that I see no appreciable change in her bad cholesterol            I have in my last office note that I wanted her to take lipitor 3 times a week.  Has she been doing this and when did she start the lipitor?            Route back pt response ------

## 2013-06-21 NOTE — Telephone Encounter (Signed)
Message copied by Mathews Robinsons on Tue Jun 21, 2013  8:46 AM ------      Message from: Raechel Chute D      Created: Tue Jun 21, 2013  7:57 AM       Karen Kitchens            Call pt and let her know that I see no appreciable change in her bad cholesterol            I have in my last office note that I wanted her to take lipitor 3 times a week.  Has she been doing this and when did she start the lipitor?            Route back pt response ------

## 2013-06-21 NOTE — Telephone Encounter (Signed)
LVM to return call.

## 2013-06-21 NOTE — Telephone Encounter (Signed)
Pt states that she will try using 2 of the red yeast rice instead of one a day. States that she has not filled the lipitor yet and will try increasing her OTC supplement before using the lipitor

## 2013-06-23 ENCOUNTER — Telehealth: Payer: Self-pay | Admitting: *Deleted

## 2013-06-27 ENCOUNTER — Encounter: Payer: Self-pay | Admitting: Internal Medicine

## 2013-06-27 ENCOUNTER — Ambulatory Visit (INDEPENDENT_AMBULATORY_CARE_PROVIDER_SITE_OTHER): Payer: Medicare Other | Admitting: Internal Medicine

## 2013-06-27 VITALS — BP 113/78 | HR 82 | Temp 98.0°F | Resp 18 | Wt 185.0 lb

## 2013-06-27 DIAGNOSIS — E785 Hyperlipidemia, unspecified: Secondary | ICD-10-CM

## 2013-06-27 NOTE — Patient Instructions (Signed)
To have labs hepatic profile in 4 weeks  See me in December

## 2013-06-27 NOTE — Progress Notes (Signed)
Subjective:    Patient ID: Kelly Burns, female    DOB: [birth year], 67 y.o.   MRN: [id number]  HPI  Kelly Burns is here for follow up.     She has seen Dr. Donell Beers who felt that her R breast thickening was corded breast tissue and she will be seeing her in 3 months  See lipids  She is taking 2 red yeast rice bid.  She repeatedly does not want a prescription medication for her hyperlipidemia  Lost of stress at home with dying husband.  She is using Ativan for sleep sparingly.  Hospice is involved.    No Known Allergies Past Medical History  Diagnosis Date  . Diverticulitis   . Hyperlipidemia    Past Surgical History  Procedure Laterality Date  . Cholecystectomy    . Appendectomy    . Tonsillectomy    . Middle ear surgery     History   Social History  . Marital Status: Married    Spouse Name: N/A    Number of Children: N/A  . Years of Education: N/A   Occupational History  . Not on file.   Social History Main Topics  . Smoking status: Former Smoker    Types: Cigars  . Smokeless tobacco: Not on file  . Alcohol Use: Yes     Comment: social  . Drug Use: No  . Sexual Activity: No   Other Topics Concern  . Not on file   Social History Narrative  . No narrative on file   Family History  Problem Relation Age of Onset  . Stroke Mother   . Heart disease Mother   . Dementia Father   . Cancer Maternal Aunt     colon  . Cancer Maternal Grandmother     breast   Patient Active Problem List   Diagnosis Date Noted  . Cyst, breast 05/30/2013  . FH: breast cancer 05/30/2013  . Cholesteatoma of right ear 05/11/2013  . Vertigo 05/11/2013  . Stress incontinence 05/11/2013  . Adhesive capsulitis of left shoulder 05/11/2013  . Cyst of right breast 05/11/2013  . Menopause 02/16/2013  . Hyperlipidemia 02/16/2013  . Situational stress 02/16/2013  . Insomnia 02/16/2013  . Dyspepsia 02/16/2013   Current Outpatient Prescriptions on File Prior to Visit  Medication Sig  Dispense Refill  . LORazepam (ATIVAN) 0.5 MG tablet Take one tablet at hs  3 times per week  20 tablet  1  . Melatonin 3 MG TABS Take 1 tablet by mouth daily.      . Probiotic Product (ALIGN PO) Take 1 tablet by mouth.      . Red Yeast Rice Extract (RED YEAST RICE PO) Take 2 tablets by mouth 2 (two) times daily.       Marland Kitchen VIT B6-VIT B12-OMEGA 3 ACIDS PO Take 1 tablet by mouth daily.       No current facility-administered medications on file prior to visit.      Review of Systems    see HPI Objective:   Physical Exam Physical Exam  Nursing note and vitals reviewed.  Constitutional: She is oriented to person, place, and time. She appears well-developed and well-nourished.  HENT:  Head: Normocephalic and atraumatic.  Cardiovascular: Normal rate and regular rhythm. Exam reveals no gallop and no friction rub.  No murmur heard.  Pulmonary/Chest: Breath sounds normal. She has no wheezes. She has no rales.  Neurological: She is alert and oriented to person, place, and time.  Skin: Skin is warm and  dry.  Psychiatric: She has a normal mood and affect. Her behavior is normal.       Assessment & Plan:  1)Hyperlipidemia    I advised she must be very careful with Red yeast rice and liver abnormalitites .She will have hepatic profile in one month and is to see me in office in December or sooner prn  2)R breast cyst: advised to keep follow up with Dr. Donell Beers  3)  Situational stress  Ativan prn  She does not want any other med at this time.  Given calendar for support group and advised to begin hospice support group

## 2013-06-29 NOTE — Telephone Encounter (Signed)
results

## 2013-07-26 ENCOUNTER — Telehealth: Payer: Self-pay | Admitting: *Deleted

## 2013-07-26 NOTE — Telephone Encounter (Signed)
Kelly Burns called and said she will come in on Thursday mid-morning to get her lab orders.

## 2013-07-28 ENCOUNTER — Other Ambulatory Visit: Payer: Self-pay | Admitting: *Deleted

## 2013-07-28 ENCOUNTER — Other Ambulatory Visit: Payer: Self-pay | Admitting: Internal Medicine

## 2013-07-28 DIAGNOSIS — E785 Hyperlipidemia, unspecified: Secondary | ICD-10-CM

## 2013-07-28 DIAGNOSIS — R748 Abnormal levels of other serum enzymes: Secondary | ICD-10-CM | POA: Diagnosis not present

## 2013-07-28 LAB — HEPATIC FUNCTION PANEL
ALT: 22 U/L (ref 0–35)
Albumin: 4.5 g/dL (ref 3.5–5.2)
Alkaline Phosphatase: 74 U/L (ref 39–117)
Indirect Bilirubin: 0.6 mg/dL (ref 0.0–0.9)
Total Protein: 6.4 g/dL (ref 6.0–8.3)

## 2013-07-28 LAB — LIPID PANEL
Cholesterol: 250 mg/dL — ABNORMAL HIGH (ref 0–200)
HDL: 46 mg/dL (ref >39)
Triglycerides: 155 mg/dL — ABNORMAL HIGH (ref <150)
VLDL: 31 mg/dL (ref 0–40)

## 2013-08-01 ENCOUNTER — Encounter: Payer: Self-pay | Admitting: *Deleted

## 2013-08-22 ENCOUNTER — Ambulatory Visit (INDEPENDENT_AMBULATORY_CARE_PROVIDER_SITE_OTHER): Payer: Medicare Other | Admitting: Internal Medicine

## 2013-08-22 ENCOUNTER — Encounter: Payer: Self-pay | Admitting: Internal Medicine

## 2013-08-22 VITALS — BP 119/72 | HR 75 | Temp 97.6°F | Resp 18 | Wt 191.0 lb

## 2013-08-22 DIAGNOSIS — E785 Hyperlipidemia, unspecified: Secondary | ICD-10-CM | POA: Diagnosis not present

## 2013-08-22 DIAGNOSIS — F4321 Adjustment disorder with depressed mood: Secondary | ICD-10-CM

## 2013-08-22 DIAGNOSIS — Z634 Disappearance and death of family member: Secondary | ICD-10-CM | POA: Insufficient documentation

## 2013-08-22 DIAGNOSIS — Z733 Stress, not elsewhere classified: Secondary | ICD-10-CM | POA: Diagnosis not present

## 2013-08-22 DIAGNOSIS — F4320 Adjustment disorder, unspecified: Secondary | ICD-10-CM | POA: Insufficient documentation

## 2013-08-22 MED ORDER — SIMVASTATIN 10 MG PO TABS: ORAL_TABLET | ORAL | Status: DC

## 2013-08-22 MED ORDER — LORAZEPAM 0.5 MG PO TABS: ORAL_TABLET | ORAL | Status: DC

## 2013-08-22 NOTE — Patient Instructions (Signed)
See me in 3 months or prn

## 2013-08-22 NOTE — Progress Notes (Signed)
Subjective:    Patient ID: Kelly Burns, female    DOB: [birth year], 67 y.o.   MRN: [id number]  HPI  Kelly Burns is here for acute grief reaction and to follow upon her hyperlipidemia.     See labs  She had declined multiple times  RX meds and wanted to try red yeast rice.    Her husband Rosanne Ashing passed on 08-09-23.   She is very tearful .  Having trouble sleeping at times.  He died in the bed they shared.    Children and church members very supportive.    No Known Allergies Past Medical History  Diagnosis Date  . Diverticulitis   . Hyperlipidemia    Past Surgical History  Procedure Laterality Date  . Cholecystectomy    . Appendectomy    . Tonsillectomy    . Middle ear surgery     History   Social History  . Marital Status: Married    Spouse Name: N/A    Number of Children: N/A  . Years of Education: N/A   Occupational History  . Not on file.   Social History Main Topics  . Smoking status: Former Smoker    Types: Cigars  . Smokeless tobacco: Not on file  . Alcohol Use: Yes     Comment: social  . Drug Use: No  . Sexual Activity: No   Other Topics Concern  . Not on file   Social History Narrative  . No narrative on file   Family History  Problem Relation Age of Onset  . Stroke Mother   . Heart disease Mother   . Dementia Father   . Cancer Maternal Aunt     colon  . Cancer Maternal Grandmother     breast   Patient Active Problem List   Diagnosis Date Noted  . Bereavement 08/22/2013  . Grief reaction 08/22/2013  . Cyst, breast 05/30/2013  . FH: breast cancer 05/30/2013  . Cholesteatoma of right ear 05/11/2013  . Vertigo 05/11/2013  . Stress incontinence 05/11/2013  . Adhesive capsulitis of left shoulder 05/11/2013  . Cyst of right breast 05/11/2013  . Menopause 02/16/2013  . Hyperlipidemia 02/16/2013  . Situational stress 02/16/2013  . Insomnia 02/16/2013  . Dyspepsia 02/16/2013   Current Outpatient Prescriptions on File Prior to Visit  Medication Sig  Dispense Refill  . Melatonin 3 MG TABS Take 1 tablet by mouth daily.      . Probiotic Product (ALIGN PO) Take 1 tablet by mouth.      . Red Yeast Rice Extract (RED YEAST RICE PO) Take 2 tablets by mouth 2 (two) times daily.       Marland Kitchen VIT B6-VIT B12-OMEGA 3 ACIDS PO Take 1 tablet by mouth daily.       No current facility-administered medications on file prior to visit.       Review of Systems See HPI    Objective:   Physical Exam  Physical Exam  Nursing note and vitals reviewed.   Crying entire interview Constitutional: She is oriented to person, place, and time. She appears well-developed and well-nourished.  HENT:  Head: Normocephalic and atraumatic.  Cardiovascular: Normal rate and regular rhythm. Exam reveals no gallop and no friction rub.  No murmur heard.  Pulmonary/Chest: Breath sounds normal. She has no wheezes. She has no rales.  Neurological: She is alert and oriented to person, place, and time.  Skin: Skin is warm and dry.  Psychiatric: She has a normal mood and affect. Her behavior  is normal.            Assessment & Plan:  Grief Geni Bers  OK to take Ativan  qhs and 1/2 tab during daytime if needed.  Advised Hospice counseling  .  She is to call me if any worsening  Spent 30 mins in counseling with her  Hyperlipidemia   She is agreeable to start low dose Zocor  10 mg 3 times a week.  Recheck with me in 3 months

## 2013-08-30 DIAGNOSIS — H908 Mixed conductive and sensorineural hearing loss, unspecified: Secondary | ICD-10-CM | POA: Diagnosis not present

## 2013-08-30 DIAGNOSIS — H6690 Otitis media, unspecified, unspecified ear: Secondary | ICD-10-CM | POA: Diagnosis not present

## 2013-08-30 DIAGNOSIS — H6120 Impacted cerumen, unspecified ear: Secondary | ICD-10-CM | POA: Diagnosis not present

## 2013-08-30 DIAGNOSIS — Z9889 Other specified postprocedural states: Secondary | ICD-10-CM | POA: Diagnosis not present

## 2013-08-30 DIAGNOSIS — H60509 Unspecified acute noninfective otitis externa, unspecified ear: Secondary | ICD-10-CM | POA: Diagnosis not present

## 2013-10-19 ENCOUNTER — Ambulatory Visit (INDEPENDENT_AMBULATORY_CARE_PROVIDER_SITE_OTHER): Payer: Medicare Other | Admitting: Internal Medicine

## 2013-10-19 ENCOUNTER — Encounter: Payer: Self-pay | Admitting: Internal Medicine

## 2013-10-19 VITALS — BP 109/71 | HR 79 | Temp 98.4°F | Resp 18 | Wt 189.0 lb

## 2013-10-19 DIAGNOSIS — Z634 Disappearance and death of family member: Secondary | ICD-10-CM | POA: Diagnosis not present

## 2013-10-19 DIAGNOSIS — E785 Hyperlipidemia, unspecified: Secondary | ICD-10-CM | POA: Diagnosis not present

## 2013-10-19 DIAGNOSIS — R3989 Other symptoms and signs involving the genitourinary system: Secondary | ICD-10-CM

## 2013-10-19 DIAGNOSIS — R399 Unspecified symptoms and signs involving the genitourinary system: Secondary | ICD-10-CM

## 2013-10-19 LAB — POCT URINALYSIS DIPSTICK

## 2013-10-19 MED ORDER — CIPROFLOXACIN HCL 500 MG PO TABS: 500.0000 mg | ORAL_TABLET | Freq: Two times a day (BID) | ORAL | Status: DC

## 2013-10-19 MED ORDER — SIMVASTATIN 10 MG PO TABS: ORAL_TABLET | ORAL | Status: DC

## 2013-10-19 NOTE — Progress Notes (Signed)
Subjective:    Patient ID: Kelly Burns, female    DOB: 02-21-1946, 68 y.o.   MRN: [id number]  HPI  Kelly Burns is here for acute visit.   36 hours of urgency, dysuria  No fever no flank pain  Toleraating Zocor no myalgias  Bereavement.  Doing OK  Lots of church activities.   She is in a prayer group at church  No Known Allergies Past Medical History  Diagnosis Date  . Diverticulitis   . Hyperlipidemia    Past Surgical History  Procedure Laterality Date  . Cholecystectomy    . Appendectomy    . Tonsillectomy    . Middle ear surgery     History   Social History  . Marital Status: Married    Spouse Name: N/A    Number of Children: N/A  . Years of Education: N/A   Occupational History  . Not on file.   Social History Main Topics  . Smoking status: Former Smoker    Types: Cigars  . Smokeless tobacco: Not on file  . Alcohol Use: Yes     Comment: social  . Drug Use: No  . Sexual Activity: No   Other Topics Concern  . Not on file   Social History Narrative  . No narrative on file   Family History  Problem Relation Age of Onset  . Stroke Mother   . Heart disease Mother   . Dementia Father   . Cancer Maternal Aunt     colon  . Cancer Maternal Grandmother     breast   Patient Active Problem List   Diagnosis Date Noted  . Bereavement 08/22/2013  . Grief reaction 08/22/2013  . Cyst, breast 05/30/2013  . FH: breast cancer 05/30/2013  . Cholesteatoma of right ear 05/11/2013  . Vertigo 05/11/2013  . Stress incontinence 05/11/2013  . Adhesive capsulitis of left shoulder 05/11/2013  . Cyst of right breast 05/11/2013  . Menopause 02/16/2013  . Hyperlipidemia 02/16/2013  . Situational stress 02/16/2013  . Insomnia 02/16/2013  . Dyspepsia 02/16/2013   Current Outpatient Prescriptions on File Prior to Visit  Medication Sig Dispense Refill  . LORazepam (ATIVAN) 0.5 MG tablet Take one tablet hs prn  30 tablet  1  . Melatonin 3 MG TABS Take 1 tablet by mouth  daily.      . Probiotic Product (ALIGN PO) Take 1 tablet by mouth.      Marland Kitchen VIT B6-VIT B12-OMEGA 3 ACIDS PO Take 1 tablet by mouth daily.      . Red Yeast Rice Extract (RED YEAST RICE PO) Take 2 tablets by mouth 2 (two) times daily.        No current facility-administered medications on file prior to visit.      Review of Systems See HPI    Objective:   Physical Exam  Physical Exam  Nursing note and vitals reviewed.  Constitutional: She is oriented to person, place, and time. She appears well-developed and well-nourished.  HENT:  Head: Normocephalic and atraumatic.  Cardiovascular: Normal rate and regular rhythm. Exam reveals no gallop and no friction rub.  No murmur heard.  Pulmonary/Chest: Breath sounds normal. She has no wheezes. She has no rales.  No CVA tenderness Abd soft NT/ND Neurological: She is alert and oriented to person, place, and time.  Skin: Skin is warm and dry.  Psychiatric: She has a normal mood and affect. Her behavior is normal.         Assessment & Plan:  [facility] will send for culture.    Cipro  500 bid for 5 days  hyeprlipidemia continue Zocor 3 days a week  Bereavement  Doing fine.

## 2013-10-20 DIAGNOSIS — H43819 Vitreous degeneration, unspecified eye: Secondary | ICD-10-CM | POA: Diagnosis not present

## 2013-10-20 DIAGNOSIS — H269 Unspecified cataract: Secondary | ICD-10-CM | POA: Diagnosis not present

## 2013-10-20 DIAGNOSIS — H53029 Refractive amblyopia, unspecified eye: Secondary | ICD-10-CM | POA: Diagnosis not present

## 2013-10-21 ENCOUNTER — Ambulatory Visit (INDEPENDENT_AMBULATORY_CARE_PROVIDER_SITE_OTHER): Payer: Medicare Other | Admitting: General Surgery

## 2013-10-26 LAB — URINE CULTURE

## 2013-11-11 ENCOUNTER — Encounter (INDEPENDENT_AMBULATORY_CARE_PROVIDER_SITE_OTHER): Payer: Self-pay | Admitting: General Surgery

## 2013-11-11 ENCOUNTER — Ambulatory Visit (INDEPENDENT_AMBULATORY_CARE_PROVIDER_SITE_OTHER): Payer: Medicare Other | Admitting: General Surgery

## 2013-11-11 VITALS — BP 124/76 | HR 76 | Temp 98.0°F | Resp 18 | Ht 67.0 in | Wt 191.0 lb

## 2013-11-11 DIAGNOSIS — N6009 Solitary cyst of unspecified breast: Secondary | ICD-10-CM | POA: Diagnosis not present

## 2013-11-11 DIAGNOSIS — N6001 Solitary cyst of right breast: Secondary | ICD-10-CM

## 2013-11-11 NOTE — Assessment & Plan Note (Signed)
The previous area of concern feels still like normal corded fibrocystic breast tissue. Given the fact that this is less discrete and is not a palpable mass, I would recommend followup as needed. Advised the patient to stay on track of her mammograms.  The patient does not need a surgical biopsy at this time.

## 2013-11-11 NOTE — Patient Instructions (Signed)
I still do not feel discrete mass, just corded breast tissue, that feels less dense than previous exam.  Follow up as needed.

## 2013-11-11 NOTE — Progress Notes (Signed)
HISTORY: The patient is a 68 year old female who returns for breast recheck. I saw her in September of 2014 to evaluate for a possible right breast mass. At that exam, the area of concern felt to me like dense corded breast tissue.  Since I saw the patient, her husband got very sick and was found to have cancer. He has passed away in the last 5-6 months. This delayed her followup. She states that she has not really examined her breasts for this she has not had any issues. She is feeling okay overall. She denies breast pain.   PERTINENT REVIEW OF SYSTEMS: Otherwise negative x 11.    Filed Vitals:   11/11/13 1159  BP: 124/76  Pulse: 76  Temp: 98 F (36.7 C)  Resp: 18   Wt Readings from Last 3 Encounters:  11/11/13 191 lb (86.637 kg)  10/19/13 189 lb (85.73 kg)  08/22/13 191 lb (86.637 kg)    EXAM: Head: Normocephalic and atraumatic.  Eyes:  Conjunctivae are normal. Pupils are equal, round, and reactive to light. No scleral icterus.  Neck:  Normal range of motion. Neck supple. No tracheal deviation present. No thyromegaly present.  Resp: No respiratory distress, normal effort. Breast:  Right breast tender at 9 o'clock, but no palpable mass.  Corded breast tissue.  Breasts dense bilaterally.   Abd:  Abdomen is soft, non distended and non tender. No masses are palpable.  There is no rebound and no guarding.  Neurological: Alert and oriented to person, place, and time. Coordination normal.  Skin: Skin is warm and dry. No rash noted. No diaphoretic. No erythema. No pallor.  Psychiatric: Normal mood and affect. Normal behavior. Judgment and thought content normal.      ASSESSMENT AND PLAN:   Cyst of right breast The previous area of concern feels still like normal corded fibrocystic breast tissue. Given the fact that this is less discrete and is not a palpable mass, I would recommend followup as needed. Advised the patient to stay on track of her mammograms.  The patient does not need  a surgical biopsy at this time.      Milus Height, MD Surgical Oncology, [facility], Joya Martyr, MD Coralyn Mark Altamese Cabal, *

## 2013-11-21 ENCOUNTER — Encounter: Payer: Self-pay | Admitting: Internal Medicine

## 2013-11-21 ENCOUNTER — Ambulatory Visit (INDEPENDENT_AMBULATORY_CARE_PROVIDER_SITE_OTHER): Payer: Medicare Other | Admitting: Internal Medicine

## 2013-11-21 ENCOUNTER — Other Ambulatory Visit: Payer: Self-pay | Admitting: *Deleted

## 2013-11-21 VITALS — BP 105/62 | HR 80 | Temp 98.1°F | Resp 18 | Wt 189.0 lb

## 2013-11-21 DIAGNOSIS — H43819 Vitreous degeneration, unspecified eye: Secondary | ICD-10-CM | POA: Diagnosis not present

## 2013-11-21 DIAGNOSIS — Z23 Encounter for immunization: Secondary | ICD-10-CM | POA: Diagnosis not present

## 2013-11-21 DIAGNOSIS — J309 Allergic rhinitis, unspecified: Secondary | ICD-10-CM | POA: Diagnosis not present

## 2013-11-21 DIAGNOSIS — Z634 Disappearance and death of family member: Secondary | ICD-10-CM

## 2013-11-21 DIAGNOSIS — H2510 Age-related nuclear cataract, unspecified eye: Secondary | ICD-10-CM | POA: Diagnosis not present

## 2013-11-21 DIAGNOSIS — H53029 Refractive amblyopia, unspecified eye: Secondary | ICD-10-CM | POA: Diagnosis not present

## 2013-11-21 DIAGNOSIS — D3190 Benign neoplasm of unspecified part of unspecified eye: Secondary | ICD-10-CM | POA: Diagnosis not present

## 2013-11-21 DIAGNOSIS — E785 Hyperlipidemia, unspecified: Secondary | ICD-10-CM

## 2013-11-21 LAB — LIPID PANEL
CHOLESTEROL: 231 mg/dL — AB (ref 0–200)
HDL: 40 mg/dL (ref >39)
LDL Cholesterol: 149 mg/dL — ABNORMAL HIGH (ref 0–99)
TRIGLYCERIDES: 212 mg/dL — AB (ref <150)
Total CHOL/HDL Ratio: 5.8 Ratio
VLDL: 42 mg/dL — ABNORMAL HIGH (ref 0–40)

## 2013-11-21 LAB — HEPATIC FUNCTION PANEL
ALBUMIN: 4.3 g/dL (ref 3.5–5.2)
ALT: 23 U/L (ref 0–35)
AST: 21 U/L (ref 0–37)
Alkaline Phosphatase: 67 U/L (ref 39–117)
Bilirubin, Direct: 0.1 mg/dL (ref 0.0–0.3)
Indirect Bilirubin: 0.5 mg/dL (ref 0.2–1.2)
TOTAL PROTEIN: 6.3 g/dL (ref 6.0–8.3)
Total Bilirubin: 0.6 mg/dL (ref 0.2–1.2)

## 2013-11-21 MED ORDER — SIMVASTATIN 10 MG PO TABS: ORAL_TABLET | ORAL | Status: DC

## 2013-11-21 NOTE — Patient Instructions (Signed)
Take claritin D or Allegra D 24 hours  For allergy  If that does not work  Get Nasonex over the counter  See me as needed

## 2013-11-21 NOTE — Progress Notes (Signed)
Subjective:    Patient ID: Kelly Burns, female    DOB: Oct 11, 1945, 68 y.o.   MRN: [id number]  HPI Kelly Burns is here for follow up after initiating Zocor 3 times a week.  Tolerating well no myalgias  She is having trouble with allergies using plain Claritin  Bereavement  Pt doing well ,  Less crying  Good days and bad days  No Known Allergies Past Medical History  Diagnosis Date  . Diverticulitis   . Hyperlipidemia    Past Surgical History  Procedure Laterality Date  . Cholecystectomy    . Appendectomy    . Tonsillectomy    . Middle ear surgery     History   Social History  . Marital Status: Married    Spouse Name: N/A    Number of Children: N/A  . Years of Education: N/A   Occupational History  . Not on file.   Social History Main Topics  . Smoking status: Former Smoker    Types: Cigars  . Smokeless tobacco: Not on file  . Alcohol Use: Yes     Comment: social  . Drug Use: No  . Sexual Activity: No   Other Topics Concern  . Not on file   Social History Narrative  . No narrative on file   Family History  Problem Relation Age of Onset  . Stroke Mother   . Heart disease Mother   . Dementia Father   . Cancer Maternal Aunt     colon  . Cancer Maternal Grandmother     breast   Patient Active Problem List   Diagnosis Date Noted  . Allergic rhinitis 11/21/2013  . Bereavement 08/22/2013  . Grief reaction 08/22/2013  . Cyst, breast 05/30/2013  . FH: breast cancer 05/30/2013  . Cholesteatoma of right ear 05/11/2013  . Vertigo 05/11/2013  . Stress incontinence 05/11/2013  . Adhesive capsulitis of left shoulder 05/11/2013  . Cyst of right breast 05/11/2013  . Menopause 02/16/2013  . Hyperlipidemia 02/16/2013  . Situational stress 02/16/2013  . Insomnia 02/16/2013  . Dyspepsia 02/16/2013   Current Outpatient Prescriptions on File Prior to Visit  Medication Sig Dispense Refill  . aspirin 81 MG tablet Take 81 mg by mouth daily.      Marland Kitchen LORazepam  (ATIVAN) 0.5 MG tablet Take one tablet hs prn  30 tablet  1  . Melatonin 3 MG TABS Take 1 tablet by mouth daily.      . Probiotic Product (ALIGN PO) Take 1 tablet by mouth.      Marland Kitchen VIT B6-VIT B12-OMEGA 3 ACIDS PO Take 1 tablet by mouth daily.       No current facility-administered medications on file prior to visit.       Review of Systems See HPI    Objective:   Physical Exam Physical Exam  Nursing note and vitals reviewed.  Constitutional: She is oriented to person, place, and time. She appears well-developed and well-nourished.  HENT:  Head: Normocephalic and atraumatic.  Cardiovascular: Normal rate and regular rhythm. Exam reveals no gallop and no friction rub.  No murmur heard.  Pulmonary/Chest: Breath sounds normal. She has no wheezes. She has no rales.  Neurological: She is alert and oriented to person, place, and time.  Skin: Skin is warm and dry.  Psychiatric: She has a normal mood and affect. Her behavior is normal.             Assessment & Plan:  Hyperlipidemia  Check lipids today  HIgh risk medcine  Check hepatic function  Allergic rhinitis  To take Claritin D or allegra D 24 hour daily.  If that does not work take [place]:  Doing as expected  See me as needed

## 2013-11-22 LAB — VARICELLA ZOSTER ANTIBODY, IGG: Varicella IgG: 919.4 Index — ABNORMAL HIGH (ref <135.00)

## 2013-11-23 ENCOUNTER — Telehealth: Payer: Self-pay | Admitting: Internal Medicine

## 2013-11-23 MED ORDER — SIMVASTATIN 10 MG PO TABS: ORAL_TABLET | ORAL | Status: DC

## 2013-11-23 NOTE — Telephone Encounter (Signed)
Spoke with pt and given lipid results  Advised to take her statin daily and see me in 3 months  Pt advised if any muslce aches or symptoms to stop statin and see me in ofifce  She  Voices understanding

## 2013-12-29 DIAGNOSIS — D485 Neoplasm of uncertain behavior of skin: Secondary | ICD-10-CM | POA: Diagnosis not present

## 2013-12-29 DIAGNOSIS — Z85828 Personal history of other malignant neoplasm of skin: Secondary | ICD-10-CM | POA: Diagnosis not present

## 2013-12-29 DIAGNOSIS — I998 Other disorder of circulatory system: Secondary | ICD-10-CM | POA: Diagnosis not present

## 2013-12-29 DIAGNOSIS — IMO0002 Reserved for concepts with insufficient information to code with codable children: Secondary | ICD-10-CM | POA: Diagnosis not present

## 2013-12-29 DIAGNOSIS — L821 Other seborrheic keratosis: Secondary | ICD-10-CM | POA: Diagnosis not present

## 2013-12-29 DIAGNOSIS — L819 Disorder of pigmentation, unspecified: Secondary | ICD-10-CM | POA: Diagnosis not present

## 2014-02-17 ENCOUNTER — Other Ambulatory Visit: Payer: Self-pay | Admitting: *Deleted

## 2014-02-17 DIAGNOSIS — E785 Hyperlipidemia, unspecified: Secondary | ICD-10-CM

## 2014-02-17 LAB — LIPID PANEL
Cholesterol: 149 mg/dL (ref 0–200)
HDL: 30 mg/dL — AB (ref >39)
LDL CALC: 94 mg/dL (ref 0–99)
TRIGLYCERIDES: 123 mg/dL (ref <150)
Total CHOL/HDL Ratio: 5 Ratio
VLDL: 25 mg/dL (ref 0–40)

## 2014-02-17 LAB — HEPATIC FUNCTION PANEL
ALBUMIN: 4.3 g/dL (ref 3.5–5.2)
ALT: 29 U/L (ref 0–35)
AST: 24 U/L (ref 0–37)
Alkaline Phosphatase: 77 U/L (ref 39–117)
BILIRUBIN TOTAL: 0.5 mg/dL (ref 0.2–1.2)
Bilirubin, Direct: 0.1 mg/dL (ref 0.0–0.3)
Indirect Bilirubin: 0.4 mg/dL (ref 0.2–1.2)
TOTAL PROTEIN: 6.4 g/dL (ref 6.0–8.3)

## 2014-02-21 ENCOUNTER — Encounter: Payer: Self-pay | Admitting: *Deleted

## 2014-02-22 ENCOUNTER — Encounter: Payer: Self-pay | Admitting: *Deleted

## 2014-02-22 ENCOUNTER — Ambulatory Visit (INDEPENDENT_AMBULATORY_CARE_PROVIDER_SITE_OTHER): Payer: Medicare Other | Admitting: Internal Medicine

## 2014-02-22 ENCOUNTER — Encounter: Payer: Self-pay | Admitting: Internal Medicine

## 2014-02-22 VITALS — BP 109/70 | HR 75 | Temp 98.2°F | Resp 16 | Wt 187.0 lb

## 2014-02-22 DIAGNOSIS — E785 Hyperlipidemia, unspecified: Secondary | ICD-10-CM | POA: Diagnosis not present

## 2014-02-22 DIAGNOSIS — Z634 Disappearance and death of family member: Secondary | ICD-10-CM | POA: Diagnosis not present

## 2014-02-22 NOTE — Progress Notes (Signed)
Subjective:    Patient ID: Kelly Burns, female    DOB: 1946-02-27, 68 y.o.   MRN: [id number]  HPI Kelly Burns is here for follow up of hyperlipidemia. She was intiated on Zocor 10 mg daily  States has occasional muscle cramps in lower legs but not sure if medication was causing this  Berevement:  Improving crying less  She is moving to a smaller house to lower her expenses .  Getting out socially   No Known Allergies Past Medical History  Diagnosis Date  . Diverticulitis   . Hyperlipidemia    Past Surgical History  Procedure Laterality Date  . Cholecystectomy    . Appendectomy    . Tonsillectomy    . Middle ear surgery     History   Social History  . Marital Status: Married    Spouse Name: N/A    Number of Children: N/A  . Years of Education: N/A   Occupational History  . Not on file.   Social History Main Topics  . Smoking status: Former Smoker    Types: Cigars  . Smokeless tobacco: Not on file  . Alcohol Use: Yes     Comment: social  . Drug Use: No  . Sexual Activity: No   Other Topics Concern  . Not on file   Social History Narrative  . No narrative on file   Family History  Problem Relation Age of Onset  . Stroke Mother   . Heart disease Mother   . Dementia Father   . Cancer Maternal Aunt     colon  . Cancer Maternal Grandmother     breast   Patient Active Problem List   Diagnosis Date Noted  . Allergic rhinitis 11/21/2013  . Bereavement 08/22/2013  . Grief reaction 08/22/2013  . Cyst, breast 05/30/2013  . FH: breast cancer 05/30/2013  . Cholesteatoma of right ear 05/11/2013  . Vertigo 05/11/2013  . Stress incontinence 05/11/2013  . Adhesive capsulitis of left shoulder 05/11/2013  . Cyst of right breast 05/11/2013  . Menopause 02/16/2013  . Hyperlipidemia 02/16/2013  . Situational stress 02/16/2013  . Insomnia 02/16/2013  . Dyspepsia 02/16/2013   Current Outpatient Prescriptions on File Prior to Visit  Medication Sig Dispense Refill  .  aspirin 81 MG tablet Take 81 mg by mouth daily.      Marland Kitchen LORazepam (ATIVAN) 0.5 MG tablet Take one tablet hs prn  30 tablet  1  . Melatonin 3 MG TABS Take 1 tablet by mouth daily.      . Probiotic Product (ALIGN PO) Take 1 tablet by mouth.      . simvastatin (ZOCOR) 10 MG tablet Take one tablet daily  90 tablet  0  . VIT B6-VIT B12-OMEGA 3 ACIDS PO Take 1 tablet by mouth daily.       No current facility-administered medications on file prior to visit.       Review of Systems    see HPI Objective:   Physical Exam  Physical Exam  Nursing note and vitals reviewed.  Constitutional: She is oriented to person, place, and time. She appears well-developed and well-nourished.  HENT:  Head: Normocephalic and atraumatic.  Cardiovascular: Normal rate and regular rhythm. Exam reveals no gallop and no friction rub.  No murmur heard.  Pulmonary/Chest: Breath sounds normal. She has no wheezes. She has no rales.  Neurological: She is alert and oriented to person, place, and time.  Skin: Skin is warm and dry.  Psychiatric: She has a  normal mood and affect. Her behavior is normal.        Assessment & Plan:  Hyperlipidemia  Ok to take Zocor 3 times a week  Bereavement:  improving

## 2014-02-22 NOTE — Patient Instructions (Signed)
See me at CPE

## 2014-02-28 DIAGNOSIS — H6980 Other specified disorders of Eustachian tube, unspecified ear: Secondary | ICD-10-CM | POA: Diagnosis not present

## 2014-02-28 DIAGNOSIS — H908 Mixed conductive and sensorineural hearing loss, unspecified: Secondary | ICD-10-CM | POA: Diagnosis not present

## 2014-02-28 DIAGNOSIS — Z9889 Other specified postprocedural states: Secondary | ICD-10-CM | POA: Diagnosis not present

## 2014-02-28 DIAGNOSIS — H6690 Otitis media, unspecified, unspecified ear: Secondary | ICD-10-CM | POA: Diagnosis not present

## 2014-02-28 DIAGNOSIS — H6120 Impacted cerumen, unspecified ear: Secondary | ICD-10-CM | POA: Diagnosis not present

## 2014-02-28 DIAGNOSIS — H60509 Unspecified acute noninfective otitis externa, unspecified ear: Secondary | ICD-10-CM | POA: Diagnosis not present

## 2014-04-11 ENCOUNTER — Encounter: Payer: Self-pay | Admitting: Internal Medicine

## 2014-04-11 ENCOUNTER — Ambulatory Visit (INDEPENDENT_AMBULATORY_CARE_PROVIDER_SITE_OTHER): Payer: Medicare Other | Admitting: Internal Medicine

## 2014-04-11 VITALS — BP 111/67 | HR 77 | Temp 97.4°F | Ht 69.0 in | Wt 185.0 lb

## 2014-04-11 DIAGNOSIS — N3090 Cystitis, unspecified without hematuria: Secondary | ICD-10-CM | POA: Diagnosis not present

## 2014-04-11 DIAGNOSIS — N390 Urinary tract infection, site not specified: Secondary | ICD-10-CM | POA: Diagnosis not present

## 2014-04-11 DIAGNOSIS — R319 Hematuria, unspecified: Secondary | ICD-10-CM

## 2014-04-11 LAB — POCT URINALYSIS DIPSTICK
Bilirubin, UA: NEGATIVE
GLUCOSE UA: NEGATIVE
Ketones, UA: NEGATIVE
NITRITE UA: POSITIVE
Protein, UA: NEGATIVE
Spec Grav, UA: 1.025
UROBILINOGEN UA: 0.2
pH, UA: 5

## 2014-04-11 MED ORDER — CIPROFLOXACIN HCL 250 MG PO TABS: ORAL_TABLET | ORAL | Status: DC

## 2014-04-11 NOTE — Patient Instructions (Signed)
See me as needed 

## 2014-04-11 NOTE — Progress Notes (Signed)
Subjective:    Patient ID: Kelly Burns, female    DOB: 10-22-1945, 68 y.o.   MRN: [id number]  HPI  Kelly Burns is here for acute visit.  2-3 days of dysuria no flank pain.  No fever  No N/V  She is closing on her new house in am and had been under a lot of stress with the move  No Known Allergies Past Medical History  Diagnosis Date  . Diverticulitis   . Hyperlipidemia    Past Surgical History  Procedure Laterality Date  . Cholecystectomy    . Appendectomy    . Tonsillectomy    . Middle ear surgery     History   Social History  . Marital Status: Married    Spouse Name: N/A    Number of Children: N/A  . Years of Education: N/A   Occupational History  . Not on file.   Social History Main Topics  . Smoking status: Former Smoker    Types: Cigars  . Smokeless tobacco: Not on file  . Alcohol Use: Yes     Comment: social  . Drug Use: No  . Sexual Activity: No   Other Topics Concern  . Not on file   Social History Narrative  . No narrative on file   Family History  Problem Relation Age of Onset  . Stroke Mother   . Heart disease Mother   . Dementia Father   . Cancer Maternal Aunt     colon  . Cancer Maternal Grandmother     breast   Patient Active Problem List   Diagnosis Date Noted  . Allergic rhinitis 11/21/2013  . Bereavement 08/22/2013  . Grief reaction 08/22/2013  . Cyst, breast 05/30/2013  . FH: breast cancer 05/30/2013  . Cholesteatoma of right ear 05/11/2013  . Vertigo 05/11/2013  . Stress incontinence 05/11/2013  . Adhesive capsulitis of left shoulder 05/11/2013  . Cyst of right breast 05/11/2013  . Menopause 02/16/2013  . Hyperlipidemia 02/16/2013  . Situational stress 02/16/2013  . Insomnia 02/16/2013  . Dyspepsia 02/16/2013   Current Outpatient Prescriptions on File Prior to Visit  Medication Sig Dispense Refill  . aspirin 81 MG tablet Take 81 mg by mouth every Wednesday and Saturday.       Marland Kitchen LORazepam (ATIVAN) 0.5 MG tablet Take one  tablet hs prn  30 tablet  1  . Melatonin 3 MG TABS Take 1 tablet by mouth daily.      . Probiotic Product (ALIGN PO) Take 1 tablet by mouth.      Marland Kitchen VIT B6-VIT B12-OMEGA 3 ACIDS PO Take 1 tablet by mouth daily.      . simvastatin (ZOCOR) 10 MG tablet Take one tablet daily  90 tablet  0   No current facility-administered medications on file prior to visit.     Review of Systems See HPI    Objective:   Physical Exam Physical Exam  Nursing note and vitals reviewed.  Constitutional: She is oriented to person, place, and time. She appears well-developed and well-nourished.  HENT:  Head: Normocephalic and atraumatic.  Cardiovascular: Normal rate and regular rhythm. Exam reveals no gallop and no friction rub.  No murmur heard.  Pulmonary/Chest: Breath sounds normal. She has no wheezes. She has no rales.  Abd:  No CVA tenderness  Soft NT/ND Neurological: She is alert and oriented to person, place, and time.  Skin: Skin is warm and dry.  Psychiatric: She has a normal mood and affect. Her behavior  is normal.              Assessment & Plan:  UTI:    Had Ecoli last 10/2013 sensitive to Cipro.  Will treat for 7 days

## 2014-04-14 LAB — CULTURE, URINE COMPREHENSIVE

## 2014-05-17 ENCOUNTER — Encounter: Payer: Medicare Other | Admitting: Internal Medicine

## 2014-05-23 ENCOUNTER — Other Ambulatory Visit: Payer: Self-pay | Admitting: *Deleted

## 2014-05-23 DIAGNOSIS — Z0000 Encounter for general adult medical examination without abnormal findings: Secondary | ICD-10-CM

## 2014-05-23 DIAGNOSIS — E559 Vitamin D deficiency, unspecified: Secondary | ICD-10-CM

## 2014-05-23 DIAGNOSIS — E785 Hyperlipidemia, unspecified: Secondary | ICD-10-CM

## 2014-05-23 DIAGNOSIS — R5383 Other fatigue: Secondary | ICD-10-CM

## 2014-05-23 DIAGNOSIS — Z760 Encounter for issue of repeat prescription: Secondary | ICD-10-CM

## 2014-05-23 DIAGNOSIS — R5381 Other malaise: Secondary | ICD-10-CM

## 2014-05-23 DIAGNOSIS — N951 Menopausal and female climacteric states: Secondary | ICD-10-CM

## 2014-05-24 ENCOUNTER — Encounter: Payer: Medicare Other | Admitting: Internal Medicine

## 2014-05-24 ENCOUNTER — Encounter: Payer: Self-pay | Admitting: Internal Medicine

## 2014-05-24 ENCOUNTER — Ambulatory Visit (INDEPENDENT_AMBULATORY_CARE_PROVIDER_SITE_OTHER): Payer: Medicare Other | Admitting: Internal Medicine

## 2014-05-24 VITALS — BP 114/70 | HR 70 | Temp 98.0°F | Resp 16 | Ht 68.5 in | Wt 181.0 lb

## 2014-05-24 DIAGNOSIS — Z23 Encounter for immunization: Secondary | ICD-10-CM

## 2014-05-24 DIAGNOSIS — E559 Vitamin D deficiency, unspecified: Secondary | ICD-10-CM

## 2014-05-24 DIAGNOSIS — E785 Hyperlipidemia, unspecified: Secondary | ICD-10-CM

## 2014-05-24 DIAGNOSIS — Z1211 Encounter for screening for malignant neoplasm of colon: Secondary | ICD-10-CM | POA: Diagnosis not present

## 2014-05-24 DIAGNOSIS — R928 Other abnormal and inconclusive findings on diagnostic imaging of breast: Secondary | ICD-10-CM

## 2014-05-24 DIAGNOSIS — Z0000 Encounter for general adult medical examination without abnormal findings: Secondary | ICD-10-CM | POA: Diagnosis not present

## 2014-05-24 DIAGNOSIS — Z1231 Encounter for screening mammogram for malignant neoplasm of breast: Secondary | ICD-10-CM

## 2014-05-24 DIAGNOSIS — F439 Reaction to severe stress, unspecified: Secondary | ICD-10-CM

## 2014-05-24 DIAGNOSIS — N6001 Solitary cyst of right breast: Secondary | ICD-10-CM

## 2014-05-24 DIAGNOSIS — G4700 Insomnia, unspecified: Secondary | ICD-10-CM

## 2014-05-24 DIAGNOSIS — Z733 Stress, not elsewhere classified: Secondary | ICD-10-CM

## 2014-05-24 DIAGNOSIS — N6009 Solitary cyst of unspecified breast: Secondary | ICD-10-CM

## 2014-05-24 LAB — POCT URINALYSIS DIPSTICK
Bilirubin, UA: NEGATIVE
Glucose, UA: NEGATIVE
Ketones, UA: NEGATIVE
LEUKOCYTES UA: NEGATIVE
Nitrite, UA: NEGATIVE
PH UA: 6.5
Protein, UA: NEGATIVE
RBC UA: NEGATIVE
Spec Grav, UA: 1.015
Urobilinogen, UA: NEGATIVE

## 2014-05-24 LAB — HEMOCCULT GUIAC POC 1CARD (OFFICE): FECAL OCCULT BLD: NEGATIVE

## 2014-05-24 LAB — CBC WITH DIFFERENTIAL/PLATELET
BASOS ABS: 0.1 10*3/uL (ref 0.0–0.1)
BASOS PCT: 1 % (ref 0–1)
EOS ABS: 0.1 10*3/uL (ref 0.0–0.7)
Eosinophils Relative: 1 % (ref 0–5)
HCT: 42.5 % (ref 36.0–46.0)
Hemoglobin: 14.4 g/dL (ref 12.0–15.0)
Lymphocytes Relative: 22 % (ref 12–46)
Lymphs Abs: 1.7 10*3/uL (ref 0.7–4.0)
MCH: 31.4 pg (ref 26.0–34.0)
MCHC: 33.9 g/dL (ref 30.0–36.0)
MCV: 92.6 fL (ref 78.0–100.0)
MONO ABS: 0.8 10*3/uL (ref 0.1–1.0)
Monocytes Relative: 10 % (ref 3–12)
NEUTROS ABS: 5 10*3/uL (ref 1.7–7.7)
Neutrophils Relative %: 66 % (ref 43–77)
Platelets: 376 10*3/uL (ref 150–400)
RBC: 4.59 MIL/uL (ref 3.87–5.11)
RDW: 13.6 % (ref 11.5–15.5)
WBC: 7.5 10*3/uL (ref 4.0–10.5)

## 2014-05-24 MED ORDER — LORAZEPAM 0.5 MG PO TABS: ORAL_TABLET | ORAL | Status: DC

## 2014-05-24 NOTE — Patient Instructions (Addendum)
To lab today   Call Dr. Cristina Gong for her colonoscopy   See me as needed   Call office in early October   For flu vaccine

## 2014-05-24 NOTE — Progress Notes (Signed)
Subjective:    Patient ID: Kelly Burns, female    DOB: 06-May-1946, 68 y.o.   MRN: [id number]  HPI  Kelly Burns is here for CPE.  She has moved into a new smaller house but has trouble sleeping.    HM:  She is due for a mm,    She has never had a colonoscopy   Pap last year neg. No history of abnormal pap smears.  She smoked for 3-4 years maxiumum  Hyperlipidemia  Lipids look great on statin 3 times per week  Insomnia  See above  In new house she is a new widow  "I'm just not settled."    No Known Allergies Past Medical History  Diagnosis Date  . Diverticulitis   . Hyperlipidemia    Past Surgical History  Procedure Laterality Date  . Cholecystectomy    . Appendectomy    . Tonsillectomy    . Middle ear surgery     History   Social History  . Marital Status: Married    Spouse Name: N/A    Number of Children: N/A  . Years of Education: N/A   Occupational History  . Not on file.   Social History Main Topics  . Smoking status: Former Smoker    Types: Cigars  . Smokeless tobacco: Never Used  . Alcohol Use: Yes     Comment: social  . Drug Use: No  . Sexual Activity: No   Other Topics Concern  . Not on file   Social History Narrative  . No narrative on file   Family History  Problem Relation Age of Onset  . Stroke Mother   . Heart disease Mother   . Dementia Father   . Cancer Maternal Aunt     colon  . Cancer Maternal Grandmother     breast   Patient Active Problem List   Diagnosis Date Noted  . Allergic rhinitis 11/21/2013  . Bereavement 08/22/2013  . Grief reaction 08/22/2013  . Cyst, breast 05/30/2013  . FH: breast cancer 05/30/2013  . Cholesteatoma of right ear 05/11/2013  . Vertigo 05/11/2013  . Stress incontinence 05/11/2013  . Adhesive capsulitis of left shoulder 05/11/2013  . Cyst of right breast 05/11/2013  . Menopause 02/16/2013  . Hyperlipidemia 02/16/2013  . Situational stress 02/16/2013  . Insomnia 02/16/2013  . Dyspepsia 02/16/2013     Current Outpatient Prescriptions on File Prior to Visit  Medication Sig Dispense Refill  . aspirin 81 MG tablet Take 81 mg by mouth every Wednesday and Saturday.       Marland Kitchen LORazepam (ATIVAN) 0.5 MG tablet Take one tablet hs prn  30 tablet  1  . Melatonin 3 MG TABS Take 1 tablet by mouth daily.      . Probiotic Product (ALIGN PO) Take 1 tablet by mouth.      . simvastatin (ZOCOR) 10 MG tablet Take one tablet daily  90 tablet  0  . VIT B6-VIT B12-OMEGA 3 ACIDS PO Take 1 tablet by mouth daily.       No current facility-administered medications on file prior to visit.       Review of Systems     Objective:   Physical Exam  Physical Exam  Nursing note and vitals reviewed.  Constitutional: She is oriented to person, place, and time. She appears well-developed and well-nourished.  HENT:  Head: Normocephalic and atraumatic.  Right Ear: Tympanic membrane and ear canal normal. No drainage. Tympanic membrane is not injected and not erythematous.  Left Ear: Tympanic membrane and ear canal normal. No drainage. Tympanic membrane is not injected and not erythematous.  Nose: Nose normal. Right sinus exhibits no maxillary sinus tenderness and no frontal sinus tenderness. Left sinus exhibits no maxillary sinus tenderness and no frontal sinus tenderness.  Mouth/Throat: Oropharynx is clear and moist. No oral lesions. No oropharyngeal exudate.  Eyes: Conjunctivae and EOM are normal. Pupils are equal, round, and reactive to light.  Neck: Normal range of motion. Neck supple. No JVD present. Carotid bruit is not present. No mass and no thyromegaly present.  Cardiovascular: Normal rate, regular rhythm, S1 normal, S2 normal and intact distal pulses. Exam reveals no gallop and no friction rub.  No murmur heard.  Pulses:  Carotid pulses are 2+ on the right side, and 2+ on the left side.  Dorsalis pedis pulses are 2+ on the right side, and 2+ on the left side.  No carotid bruit. No LE edema   Pulmonary/Chest: Breath sounds normal. She has no wheezes. She has no rales. She exhibits no tenderness. Breast Right  She has multiple easily moveable masses near right nipple  (same location as February exam) Abdominal: Soft. Bowel sounds are normal. She exhibits no distension and no mass. There is no hepatosplenomegaly. There is no tenderness. There is no CVA tenderness.  Musculoskeletal: Normal range of motion.  No active synovitis to joints.  Lymphadenopathy:  She has no cervical adenopathy.  She has no axillary adenopathy.  Right: No inguinal and no supraclavicular adenopathy present.  Left: No inguinal and no supraclavicular adenopathy present.  Neurological: She is alert and oriented to person, place, and time. She has normal strength and normal reflexes. She displays no tremor. No cranial nerve deficit or sensory deficit. Coordination and gait normal.  Skin: Skin is warm and dry. No rash noted. No cyanosis. Nails show no clubbing.  She has several skin tags left azilla Psychiatric: She has a normal mood and affect. Her speech is normal and behavior is normal. Cognition and memory are normal.          Assessment & Plan:  HM:    MM see below  Pt to call Dr. Cristina Gong for her first colonoscopy.  Pap in 2 years.  Dexa due next year .  Advised Prevnar 13. And flu vaccine .  Pt wishes to check with insurance .    R breast mass similar location as breast cyst  in February - pt has been evalauted by Dr. Barry Dienes  Will get dx mm  Osteopenia  Continue Calcium vitamin D  Skin lesions axilla  Pt advised to call her dermatologist for appt.  She voices understanding  Insomnia  Ok to take prn BZD   Intermittant chronic vertigo post cholesteatoma treatment    Berevement/situational stress improving .  She is in a new environment and making friends.   Will give TDAP today

## 2014-05-25 LAB — COMPLETE METABOLIC PANEL WITH GFR
ALBUMIN: 4.7 g/dL (ref 3.5–5.2)
ALK PHOS: 71 U/L (ref 39–117)
ALT: 31 U/L (ref 0–35)
AST: 21 U/L (ref 0–37)
BILIRUBIN TOTAL: 0.4 mg/dL (ref 0.2–1.2)
BUN: 13 mg/dL (ref 6–23)
CO2: 24 mEq/L (ref 19–32)
Calcium: 9.9 mg/dL (ref 8.4–10.5)
Chloride: 104 mEq/L (ref 96–112)
Creat: 0.71 mg/dL (ref 0.50–1.10)
GFR, EST NON AFRICAN AMERICAN: 89 mL/min
GFR, Est African American: >89 mL/min
GLUCOSE: 100 mg/dL — AB (ref 70–99)
POTASSIUM: 4.3 meq/L (ref 3.5–5.3)
Sodium: 137 mEq/L (ref 135–145)
Total Protein: 6.6 g/dL (ref 6.0–8.3)

## 2014-05-25 LAB — TSH: TSH: 1.344 u[IU]/mL (ref 0.350–4.500)

## 2014-05-25 LAB — VITAMIN D 25 HYDROXY (VIT D DEFICIENCY, FRACTURES): Vit D, 25-Hydroxy: 27 ng/mL — ABNORMAL LOW (ref 30–89)

## 2014-05-26 NOTE — Progress Notes (Signed)
Spoke with Kelly Burns and she will get some Vitamin D at [facility]. I mailed her a copy of her labs.-eh

## 2014-06-01 ENCOUNTER — Ambulatory Visit
Admission: RE | Admit: 2014-06-01 | Discharge: 2014-06-01 | Disposition: A | Discharge status: Home / Self Care | Payer: Medicare Other | Source: Ambulatory Visit | Attending: Internal Medicine | Admitting: Internal Medicine

## 2014-06-01 ENCOUNTER — Other Ambulatory Visit: Payer: Self-pay | Admitting: Internal Medicine

## 2014-06-01 DIAGNOSIS — N6001 Solitary cyst of right breast: Secondary | ICD-10-CM

## 2014-06-01 DIAGNOSIS — D249 Benign neoplasm of unspecified breast: Secondary | ICD-10-CM | POA: Diagnosis not present

## 2014-06-01 DIAGNOSIS — N63 Unspecified lump in unspecified breast: Secondary | ICD-10-CM | POA: Diagnosis not present

## 2014-06-05 ENCOUNTER — Telehealth: Payer: Self-pay

## 2014-06-05 NOTE — Progress Notes (Signed)
Kelly Burns is aware that her breast biopsy was benigh-eh

## 2014-06-05 NOTE — Telephone Encounter (Signed)
Kelly Burns returned your call [phone] (M)

## 2014-07-03 ENCOUNTER — Encounter: Payer: Self-pay | Admitting: Internal Medicine

## 2014-07-03 DIAGNOSIS — D241 Benign neoplasm of right breast: Secondary | ICD-10-CM | POA: Insufficient documentation

## 2014-07-06 ENCOUNTER — Ambulatory Visit (INDEPENDENT_AMBULATORY_CARE_PROVIDER_SITE_OTHER): Payer: Medicare Other | Admitting: *Deleted

## 2014-07-06 DIAGNOSIS — Z23 Encounter for immunization: Secondary | ICD-10-CM | POA: Diagnosis not present

## 2014-08-30 DIAGNOSIS — L929 Granulomatous disorder of the skin and subcutaneous tissue, unspecified: Secondary | ICD-10-CM | POA: Diagnosis not present

## 2014-08-30 DIAGNOSIS — H6981 Other specified disorders of Eustachian tube, right ear: Secondary | ICD-10-CM | POA: Diagnosis not present

## 2014-08-30 DIAGNOSIS — Z9889 Other specified postprocedural states: Secondary | ICD-10-CM | POA: Diagnosis not present

## 2014-08-30 DIAGNOSIS — H9211 Otorrhea, right ear: Secondary | ICD-10-CM | POA: Diagnosis not present

## 2014-08-30 DIAGNOSIS — H9071 Mixed conductive and sensorineural hearing loss, unilateral, right ear, with unrestricted hearing on the contralateral side: Secondary | ICD-10-CM | POA: Diagnosis not present

## 2014-08-30 DIAGNOSIS — H9042 Sensorineural hearing loss, unilateral, left ear, with unrestricted hearing on the contralateral side: Secondary | ICD-10-CM | POA: Diagnosis not present

## 2014-12-27 ENCOUNTER — Other Ambulatory Visit: Payer: Self-pay | Admitting: *Deleted

## 2014-12-27 NOTE — Telephone Encounter (Signed)
Refill request

## 2014-12-28 MED ORDER — LORAZEPAM 0.5 MG PO TABS: ORAL_TABLET | ORAL | Status: DC

## 2014-12-28 NOTE — Telephone Encounter (Signed)
RX called in .

## 2015-01-03 DIAGNOSIS — D1801 Hemangioma of skin and subcutaneous tissue: Secondary | ICD-10-CM | POA: Diagnosis not present

## 2015-01-03 DIAGNOSIS — L812 Freckles: Secondary | ICD-10-CM | POA: Diagnosis not present

## 2015-01-03 DIAGNOSIS — L821 Other seborrheic keratosis: Secondary | ICD-10-CM | POA: Diagnosis not present

## 2015-01-03 DIAGNOSIS — Z85828 Personal history of other malignant neoplasm of skin: Secondary | ICD-10-CM | POA: Diagnosis not present

## 2015-01-03 DIAGNOSIS — L859 Epidermal thickening, unspecified: Secondary | ICD-10-CM | POA: Diagnosis not present

## 2015-01-26 DIAGNOSIS — H18412 Arcus senilis, left eye: Secondary | ICD-10-CM | POA: Diagnosis not present

## 2015-01-26 DIAGNOSIS — H18411 Arcus senilis, right eye: Secondary | ICD-10-CM | POA: Diagnosis not present

## 2015-01-26 DIAGNOSIS — H2511 Age-related nuclear cataract, right eye: Secondary | ICD-10-CM | POA: Diagnosis not present

## 2015-01-26 DIAGNOSIS — H02839 Dermatochalasis of unspecified eye, unspecified eyelid: Secondary | ICD-10-CM | POA: Diagnosis not present

## 2015-01-29 DIAGNOSIS — J019 Acute sinusitis, unspecified: Secondary | ICD-10-CM | POA: Diagnosis not present

## 2015-01-29 DIAGNOSIS — R05 Cough: Secondary | ICD-10-CM | POA: Diagnosis not present

## 2015-02-09 IMAGING — MG MM DIGITAL SCREENING BILAT
5 series · 5 of 5 positions shown · non-contrast
Comparison: Previous exams.

CLINICAL DATA: Screening.

DIGITAL SCREENING BILATERAL MAMMOGRAM WITH CAD

[R CC (1 of 2)]
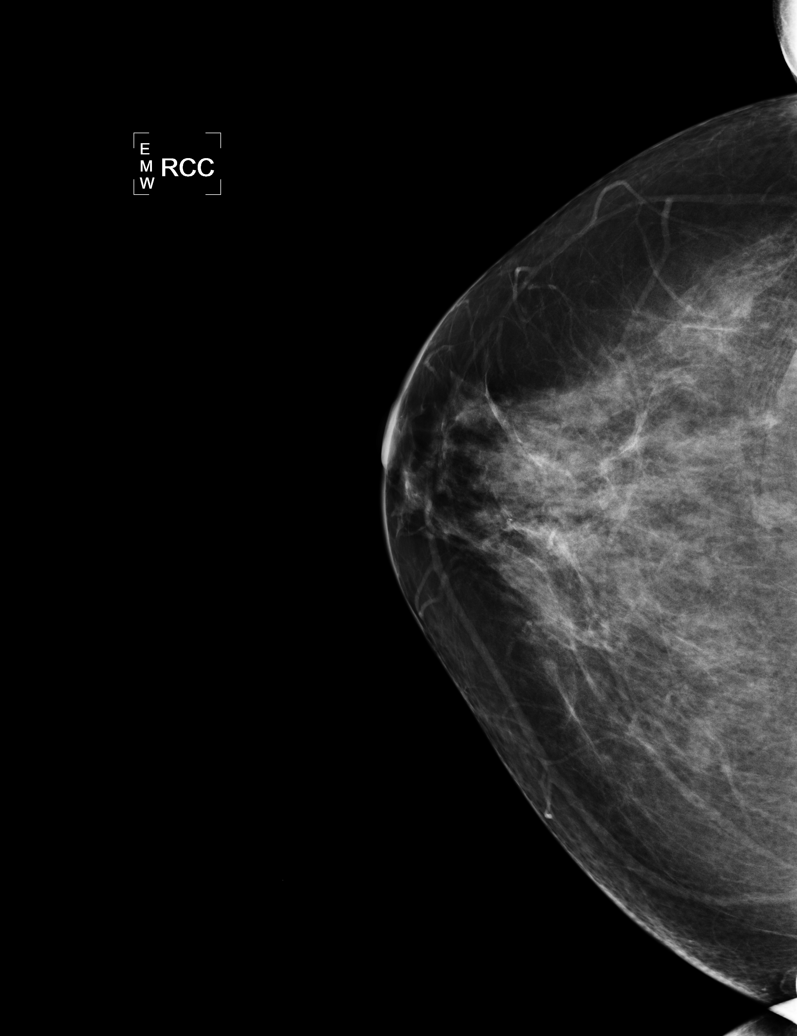

[L CC]
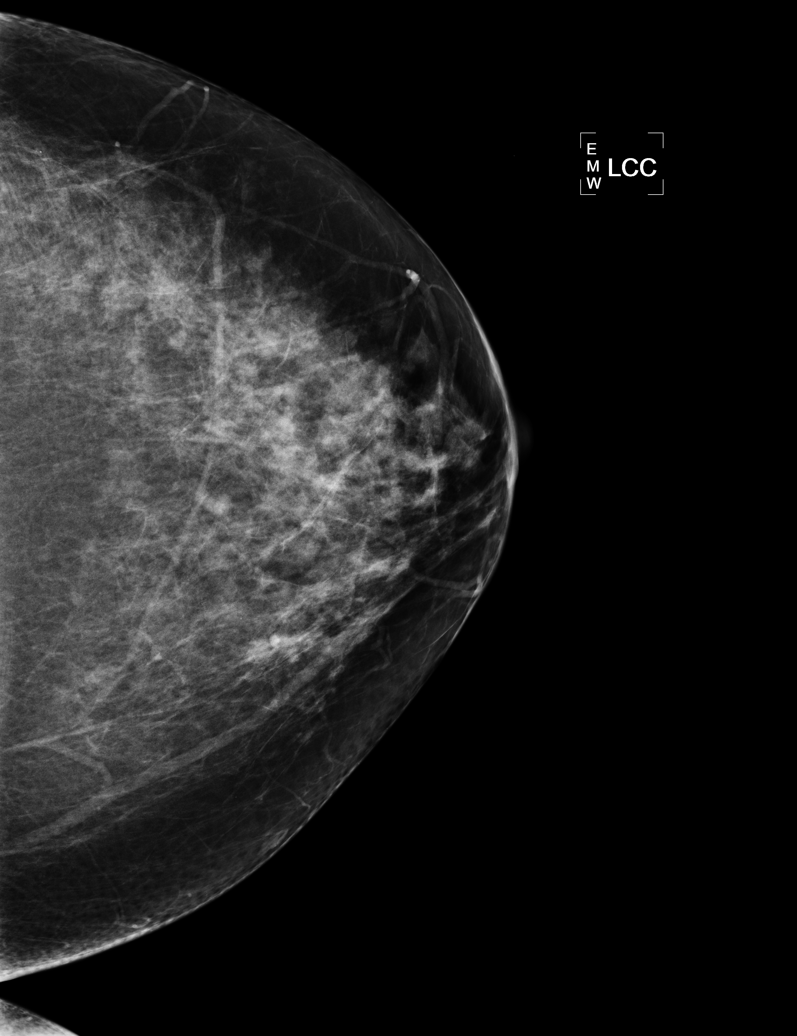

[L MLO]
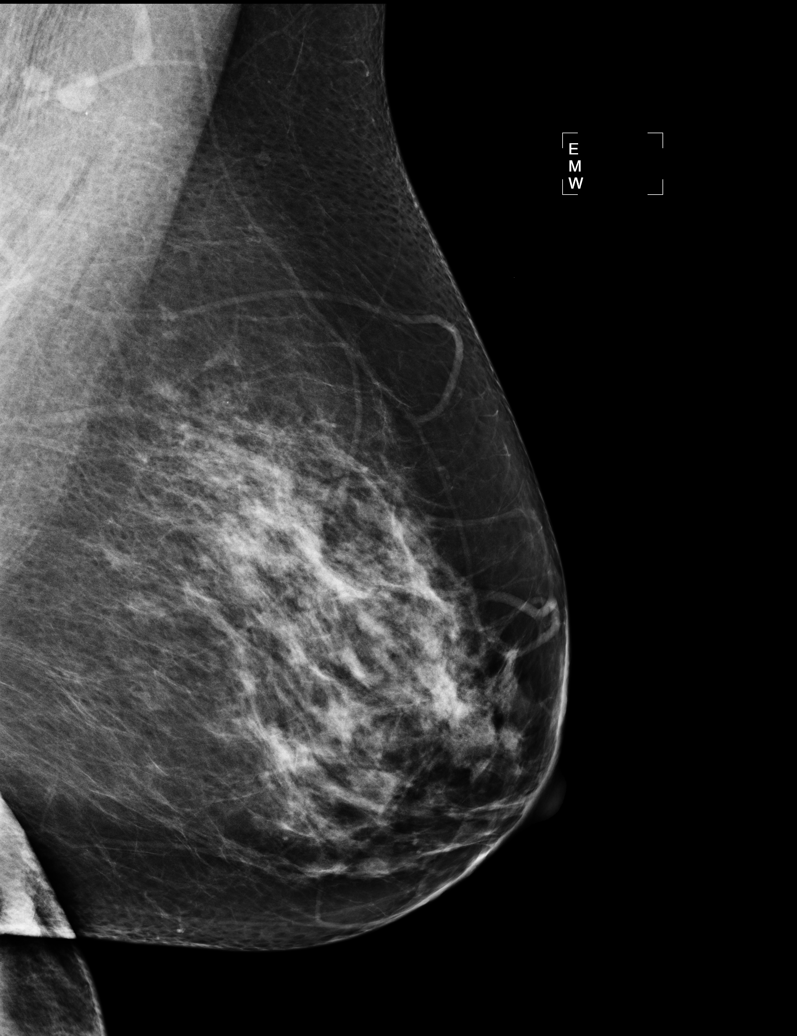

[R MLO]
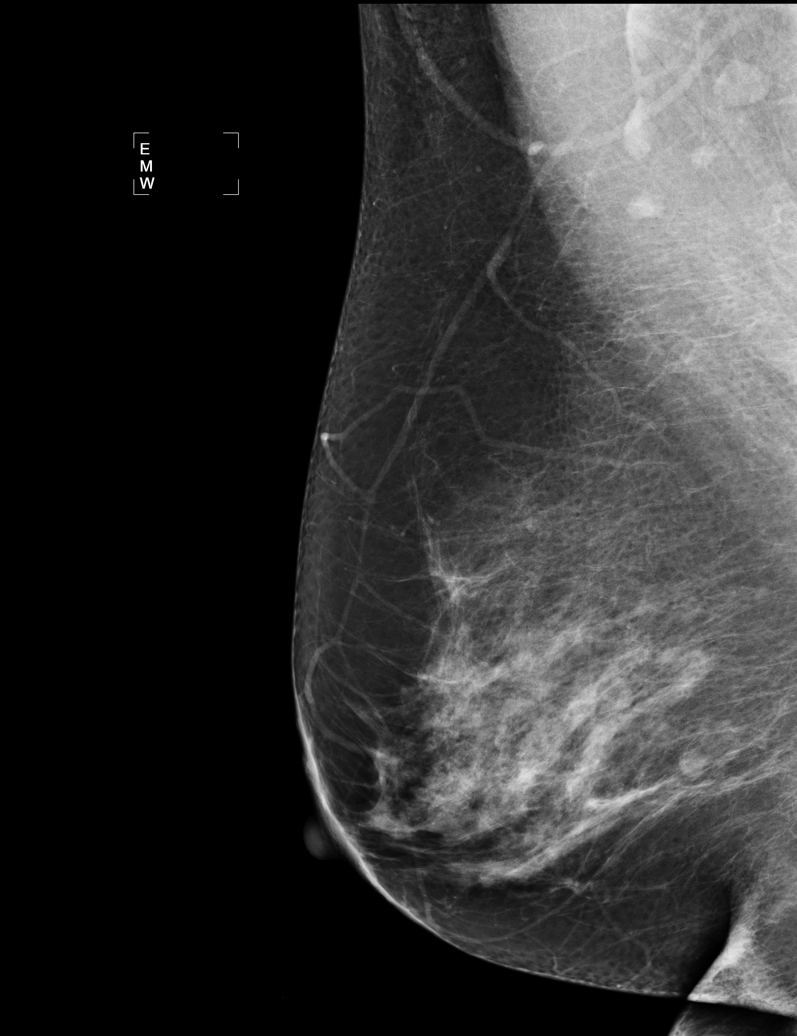

[R CC (2 of 2)]
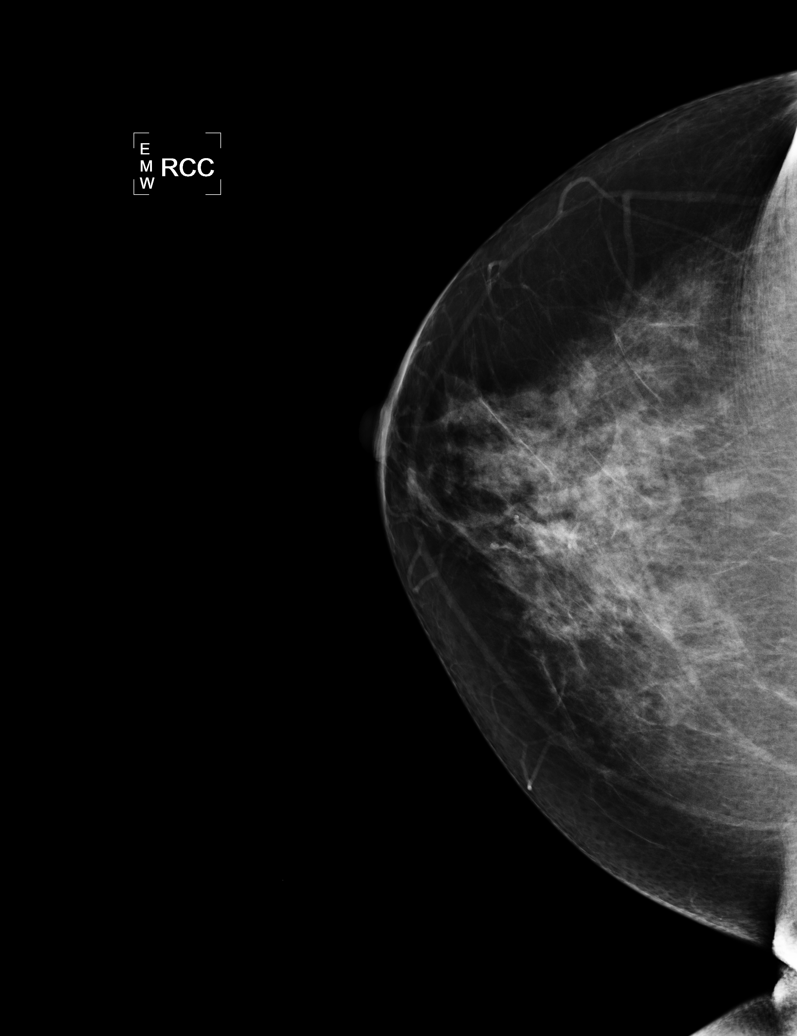

[5 of 5 positions shown; findings below may reference images not displayed]

FINDINGS: ACR Breast Density Category c: The breast tissue is heterogeneously
dense.

In the right breast, a possible mass warrants further evaluation
with spot compression views and possibly ultrasound.  In the left
breast, no masses or malignant type calcifications are identified.

Images were processed with CAD.
IMPRESSION: Further evaluation is suggested for possible mass in the right
breast.

RECOMMENDATION:
Diagnostic mammogram and possibly ultrasound of the right breast.
(Code:8N-S-CC5)

The patient will be contacted regarding the findings, and
additional imaging will be scheduled.

BI-RADS CATEGORY 0:  Incomplete.  Need additional imaging
evaluation and/or prior mammograms for comparison.

## 2015-05-04 DIAGNOSIS — H2511 Age-related nuclear cataract, right eye: Secondary | ICD-10-CM | POA: Diagnosis not present

## 2015-05-04 DIAGNOSIS — H2512 Age-related nuclear cataract, left eye: Secondary | ICD-10-CM | POA: Diagnosis not present

## 2015-05-04 DIAGNOSIS — H25811 Combined forms of age-related cataract, right eye: Secondary | ICD-10-CM | POA: Diagnosis not present

## 2015-05-18 DIAGNOSIS — H2512 Age-related nuclear cataract, left eye: Secondary | ICD-10-CM | POA: Diagnosis not present

## 2015-05-18 DIAGNOSIS — H25812 Combined forms of age-related cataract, left eye: Secondary | ICD-10-CM | POA: Diagnosis not present

## 2015-05-23 DIAGNOSIS — K5730 Diverticulosis of large intestine without perforation or abscess without bleeding: Secondary | ICD-10-CM | POA: Diagnosis not present

## 2015-05-23 DIAGNOSIS — F5101 Primary insomnia: Secondary | ICD-10-CM | POA: Diagnosis not present

## 2015-05-23 DIAGNOSIS — K580 Irritable bowel syndrome with diarrhea: Secondary | ICD-10-CM | POA: Diagnosis not present

## 2015-05-23 DIAGNOSIS — M159 Polyosteoarthritis, unspecified: Secondary | ICD-10-CM | POA: Diagnosis not present

## 2015-07-18 DIAGNOSIS — F5101 Primary insomnia: Secondary | ICD-10-CM | POA: Diagnosis not present

## 2015-07-18 DIAGNOSIS — R5382 Chronic fatigue, unspecified: Secondary | ICD-10-CM | POA: Diagnosis not present

## 2015-07-18 DIAGNOSIS — M159 Polyosteoarthritis, unspecified: Secondary | ICD-10-CM | POA: Diagnosis not present

## 2015-07-18 DIAGNOSIS — E789 Disorder of lipoprotein metabolism, unspecified: Secondary | ICD-10-CM | POA: Diagnosis not present

## 2015-07-25 ENCOUNTER — Other Ambulatory Visit: Payer: Self-pay

## 2015-07-25 DIAGNOSIS — M159 Polyosteoarthritis, unspecified: Secondary | ICD-10-CM | POA: Diagnosis not present

## 2015-07-25 DIAGNOSIS — K5730 Diverticulosis of large intestine without perforation or abscess without bleeding: Secondary | ICD-10-CM | POA: Diagnosis not present

## 2015-07-25 DIAGNOSIS — K580 Irritable bowel syndrome with diarrhea: Secondary | ICD-10-CM | POA: Diagnosis not present

## 2015-07-25 DIAGNOSIS — Z1231 Encounter for screening mammogram for malignant neoplasm of breast: Secondary | ICD-10-CM

## 2015-07-25 DIAGNOSIS — R5382 Chronic fatigue, unspecified: Secondary | ICD-10-CM | POA: Diagnosis not present

## 2015-08-29 ENCOUNTER — Ambulatory Visit
Admission: RE | Admit: 2015-08-29 | Discharge: 2015-08-29 | Disposition: A | Discharge status: Home / Self Care | Payer: Medicare Other | Source: Ambulatory Visit

## 2015-08-29 DIAGNOSIS — Z1231 Encounter for screening mammogram for malignant neoplasm of breast: Secondary | ICD-10-CM

## 2016-01-02 DIAGNOSIS — L812 Freckles: Secondary | ICD-10-CM | POA: Diagnosis not present

## 2016-01-02 DIAGNOSIS — Z85828 Personal history of other malignant neoplasm of skin: Secondary | ICD-10-CM | POA: Diagnosis not present

## 2016-01-02 DIAGNOSIS — L821 Other seborrheic keratosis: Secondary | ICD-10-CM | POA: Diagnosis not present

## 2016-01-02 DIAGNOSIS — L309 Dermatitis, unspecified: Secondary | ICD-10-CM | POA: Diagnosis not present

## 2016-01-02 DIAGNOSIS — D1801 Hemangioma of skin and subcutaneous tissue: Secondary | ICD-10-CM | POA: Diagnosis not present

## 2016-03-06 DIAGNOSIS — M5430 Sciatica, unspecified side: Secondary | ICD-10-CM | POA: Diagnosis not present

## 2016-03-06 DIAGNOSIS — M25559 Pain in unspecified hip: Secondary | ICD-10-CM | POA: Diagnosis not present

## 2016-03-06 DIAGNOSIS — M25551 Pain in right hip: Secondary | ICD-10-CM | POA: Diagnosis not present

## 2016-03-31 DIAGNOSIS — M7071 Other bursitis of hip, right hip: Secondary | ICD-10-CM | POA: Diagnosis not present

## 2016-04-30 DIAGNOSIS — M7071 Other bursitis of hip, right hip: Secondary | ICD-10-CM | POA: Diagnosis not present

## 2016-06-04 DIAGNOSIS — M7071 Other bursitis of hip, right hip: Secondary | ICD-10-CM | POA: Diagnosis not present

## 2016-07-02 DIAGNOSIS — Z23 Encounter for immunization: Secondary | ICD-10-CM | POA: Diagnosis not present

## 2016-07-21 DIAGNOSIS — K580 Irritable bowel syndrome with diarrhea: Secondary | ICD-10-CM | POA: Diagnosis not present

## 2016-07-21 DIAGNOSIS — E789 Disorder of lipoprotein metabolism, unspecified: Secondary | ICD-10-CM | POA: Diagnosis not present

## 2016-07-21 DIAGNOSIS — Z0000 Encounter for general adult medical examination without abnormal findings: Secondary | ICD-10-CM | POA: Diagnosis not present

## 2016-07-21 DIAGNOSIS — R5382 Chronic fatigue, unspecified: Secondary | ICD-10-CM | POA: Diagnosis not present

## 2016-07-21 DIAGNOSIS — Z780 Asymptomatic menopausal state: Secondary | ICD-10-CM | POA: Diagnosis not present

## 2016-07-21 DIAGNOSIS — M159 Polyosteoarthritis, unspecified: Secondary | ICD-10-CM | POA: Diagnosis not present

## 2016-07-21 DIAGNOSIS — K5730 Diverticulosis of large intestine without perforation or abscess without bleeding: Secondary | ICD-10-CM | POA: Diagnosis not present

## 2016-07-28 DIAGNOSIS — M810 Age-related osteoporosis without current pathological fracture: Secondary | ICD-10-CM | POA: Diagnosis not present

## 2016-07-28 DIAGNOSIS — M25551 Pain in right hip: Secondary | ICD-10-CM | POA: Diagnosis not present

## 2016-07-28 DIAGNOSIS — R5382 Chronic fatigue, unspecified: Secondary | ICD-10-CM | POA: Diagnosis not present

## 2016-07-28 DIAGNOSIS — R143 Flatulence: Secondary | ICD-10-CM | POA: Diagnosis not present

## 2016-07-28 DIAGNOSIS — M159 Polyosteoarthritis, unspecified: Secondary | ICD-10-CM | POA: Diagnosis not present

## 2016-10-13 DIAGNOSIS — Z1211 Encounter for screening for malignant neoplasm of colon: Secondary | ICD-10-CM | POA: Diagnosis not present

## 2016-10-13 LAB — HM COLONOSCOPY

## 2016-11-19 DIAGNOSIS — F4323 Adjustment disorder with mixed anxiety and depressed mood: Secondary | ICD-10-CM | POA: Diagnosis not present

## 2016-12-03 DIAGNOSIS — F4323 Adjustment disorder with mixed anxiety and depressed mood: Secondary | ICD-10-CM | POA: Diagnosis not present

## 2017-01-07 DIAGNOSIS — F4323 Adjustment disorder with mixed anxiety and depressed mood: Secondary | ICD-10-CM | POA: Diagnosis not present

## 2017-01-08 DIAGNOSIS — D1801 Hemangioma of skin and subcutaneous tissue: Secondary | ICD-10-CM | POA: Diagnosis not present

## 2017-01-08 DIAGNOSIS — L729 Follicular cyst of the skin and subcutaneous tissue, unspecified: Secondary | ICD-10-CM | POA: Diagnosis not present

## 2017-01-08 DIAGNOSIS — Z85828 Personal history of other malignant neoplasm of skin: Secondary | ICD-10-CM | POA: Diagnosis not present

## 2017-01-08 DIAGNOSIS — L820 Inflamed seborrheic keratosis: Secondary | ICD-10-CM | POA: Diagnosis not present

## 2017-01-08 DIAGNOSIS — L309 Dermatitis, unspecified: Secondary | ICD-10-CM | POA: Diagnosis not present

## 2017-01-08 DIAGNOSIS — L821 Other seborrheic keratosis: Secondary | ICD-10-CM | POA: Diagnosis not present

## 2017-01-22 DIAGNOSIS — L820 Inflamed seborrheic keratosis: Secondary | ICD-10-CM | POA: Diagnosis not present

## 2017-02-26 DIAGNOSIS — F4323 Adjustment disorder with mixed anxiety and depressed mood: Secondary | ICD-10-CM | POA: Diagnosis not present

## 2017-03-12 DIAGNOSIS — F4323 Adjustment disorder with mixed anxiety and depressed mood: Secondary | ICD-10-CM | POA: Diagnosis not present

## 2017-04-07 DIAGNOSIS — F4323 Adjustment disorder with mixed anxiety and depressed mood: Secondary | ICD-10-CM | POA: Diagnosis not present

## 2017-04-29 DIAGNOSIS — F4323 Adjustment disorder with mixed anxiety and depressed mood: Secondary | ICD-10-CM | POA: Diagnosis not present

## 2017-05-19 DIAGNOSIS — F4323 Adjustment disorder with mixed anxiety and depressed mood: Secondary | ICD-10-CM | POA: Diagnosis not present

## 2017-06-02 DIAGNOSIS — F4323 Adjustment disorder with mixed anxiety and depressed mood: Secondary | ICD-10-CM | POA: Diagnosis not present

## 2017-06-17 DIAGNOSIS — F4323 Adjustment disorder with mixed anxiety and depressed mood: Secondary | ICD-10-CM | POA: Diagnosis not present

## 2017-06-26 DIAGNOSIS — Z23 Encounter for immunization: Secondary | ICD-10-CM | POA: Diagnosis not present

## 2017-07-06 DIAGNOSIS — F4323 Adjustment disorder with mixed anxiety and depressed mood: Secondary | ICD-10-CM | POA: Diagnosis not present

## 2017-07-21 DIAGNOSIS — F4323 Adjustment disorder with mixed anxiety and depressed mood: Secondary | ICD-10-CM | POA: Diagnosis not present

## 2017-08-14 DIAGNOSIS — F4323 Adjustment disorder with mixed anxiety and depressed mood: Secondary | ICD-10-CM | POA: Diagnosis not present

## 2017-08-19 DIAGNOSIS — N390 Urinary tract infection, site not specified: Secondary | ICD-10-CM | POA: Diagnosis not present

## 2017-09-10 DIAGNOSIS — R3915 Urgency of urination: Secondary | ICD-10-CM | POA: Diagnosis not present

## 2017-09-28 DIAGNOSIS — F4323 Adjustment disorder with mixed anxiety and depressed mood: Secondary | ICD-10-CM | POA: Diagnosis not present

## 2017-10-13 DIAGNOSIS — F4323 Adjustment disorder with mixed anxiety and depressed mood: Secondary | ICD-10-CM | POA: Diagnosis not present

## 2017-10-26 DIAGNOSIS — L710 Perioral dermatitis: Secondary | ICD-10-CM | POA: Diagnosis not present

## 2017-10-26 DIAGNOSIS — L309 Dermatitis, unspecified: Secondary | ICD-10-CM | POA: Diagnosis not present

## 2017-10-27 DIAGNOSIS — F4323 Adjustment disorder with mixed anxiety and depressed mood: Secondary | ICD-10-CM | POA: Diagnosis not present

## 2017-11-10 DIAGNOSIS — L710 Perioral dermatitis: Secondary | ICD-10-CM | POA: Diagnosis not present

## 2017-11-10 DIAGNOSIS — D485 Neoplasm of uncertain behavior of skin: Secondary | ICD-10-CM | POA: Diagnosis not present

## 2017-11-10 DIAGNOSIS — L439 Lichen planus, unspecified: Secondary | ICD-10-CM | POA: Diagnosis not present

## 2017-11-10 DIAGNOSIS — L2084 Intrinsic (allergic) eczema: Secondary | ICD-10-CM | POA: Diagnosis not present

## 2017-11-13 DIAGNOSIS — L439 Lichen planus, unspecified: Secondary | ICD-10-CM | POA: Diagnosis not present

## 2017-12-02 DIAGNOSIS — F4323 Adjustment disorder with mixed anxiety and depressed mood: Secondary | ICD-10-CM | POA: Diagnosis not present

## 2017-12-10 ENCOUNTER — Ambulatory Visit (INDEPENDENT_AMBULATORY_CARE_PROVIDER_SITE_OTHER): Payer: Medicare Other | Admitting: Podiatry

## 2017-12-10 ENCOUNTER — Encounter: Payer: Self-pay | Admitting: Podiatry

## 2017-12-10 VITALS — BP 130/81 | HR 85

## 2017-12-10 DIAGNOSIS — M79609 Pain in unspecified limb: Secondary | ICD-10-CM | POA: Diagnosis not present

## 2017-12-10 DIAGNOSIS — B351 Tinea unguium: Secondary | ICD-10-CM | POA: Diagnosis not present

## 2017-12-10 NOTE — Progress Notes (Signed)
  Subjective:  Patient ID: Kelly Burns, female    DOB: 1946/08/16,  MRN: [id number]  Chief Complaint  Patient presents with  . Nail Problem    bilateral hallux nails are sore, very painful when wearing enclosed shoes   72 y.o. female presents with the above complaint.  Reports painful nails to both great toes.  States that she has difficulty wearing closed toed shoes.  Reports getting Pedicures that is unable to alleviate the pain in her big toenails.  Believes nails to be ingrown.  Reports the nails are very thick.  Denies nausea vomiting fever chills  Past Medical History:  Diagnosis Date  . Diverticulitis   . Hyperlipidemia    Past Surgical History:  Procedure Laterality Date  . APPENDECTOMY    . CHOLECYSTECTOMY    . MIDDLE EAR SURGERY    . TONSILLECTOMY      Current Outpatient Medications:  .  acetic acid 2 % otic solution, 5-6 irrigations twice daily for 3 days for episodes of drainage. Apply to ear canal opening on right/ mastoid cavity for approximately 3 min., Disp: , Rfl:  .  LORazepam (ATIVAN) 0.5 MG tablet, Take one tablet hs prn, Disp: 30 tablet, Rfl: 0 .  pyridoxine (B-6) 100 MG tablet, Take by mouth., Disp: , Rfl:  .  VIT B6-VIT B12-OMEGA 3 ACIDS PO, Take 1 tablet by mouth daily., Disp: , Rfl:   Allergies  Allergen Reactions  . Other Itching and Rash    CSF-HC powder applied to the mastoid cavity results in severe itching and ear drainage.   Review of Systems Objective:   Vitals:   12/10/17 0836  BP: 130/81  Pulse: 85   General AA&O x3. Normal mood and affect.  Vascular Dorsalis pedis and posterior tibial pulses  present 2+ bilaterally  Capillary refill normal to all digits. Pedal hair growth normal.  Neurologic Epicritic sensation grossly present.  Dermatologic No open lesions. Interspaces clear of maceration. Bilateral hallux nail thickening with pain to palpation  Orthopedic: MMT 5/5 in dorsiflexion, plantarflexion, inversion, and eversion. Normal  joint ROM without pain or crepitus.   Assessment & Plan:  Patient was evaluated and treated and all questions answered.  Onychomycosis with pain -Nails x2 palliatively debrided with nail nipper and rotary bur.  Patient tolerated relief post debridement.  Educated on proper self-care.  Return as needed.  Return if symptoms worsen or fail to improve.

## 2017-12-18 DIAGNOSIS — E789 Disorder of lipoprotein metabolism, unspecified: Secondary | ICD-10-CM | POA: Diagnosis not present

## 2017-12-18 DIAGNOSIS — Z0000 Encounter for general adult medical examination without abnormal findings: Secondary | ICD-10-CM | POA: Diagnosis not present

## 2017-12-18 DIAGNOSIS — R5382 Chronic fatigue, unspecified: Secondary | ICD-10-CM | POA: Diagnosis not present

## 2017-12-18 DIAGNOSIS — Z23 Encounter for immunization: Secondary | ICD-10-CM | POA: Diagnosis not present

## 2017-12-25 DIAGNOSIS — H81391 Other peripheral vertigo, right ear: Secondary | ICD-10-CM | POA: Diagnosis not present

## 2017-12-25 DIAGNOSIS — R5382 Chronic fatigue, unspecified: Secondary | ICD-10-CM | POA: Diagnosis not present

## 2017-12-25 DIAGNOSIS — M159 Polyosteoarthritis, unspecified: Secondary | ICD-10-CM | POA: Diagnosis not present

## 2017-12-25 DIAGNOSIS — K5730 Diverticulosis of large intestine without perforation or abscess without bleeding: Secondary | ICD-10-CM | POA: Diagnosis not present

## 2017-12-25 DIAGNOSIS — M810 Age-related osteoporosis without current pathological fracture: Secondary | ICD-10-CM | POA: Diagnosis not present

## 2017-12-25 DIAGNOSIS — K580 Irritable bowel syndrome with diarrhea: Secondary | ICD-10-CM | POA: Diagnosis not present

## 2018-01-07 DIAGNOSIS — F4323 Adjustment disorder with mixed anxiety and depressed mood: Secondary | ICD-10-CM | POA: Diagnosis not present

## 2018-01-20 DIAGNOSIS — F4323 Adjustment disorder with mixed anxiety and depressed mood: Secondary | ICD-10-CM | POA: Diagnosis not present

## 2018-04-12 DIAGNOSIS — D225 Melanocytic nevi of trunk: Secondary | ICD-10-CM | POA: Diagnosis not present

## 2018-04-12 DIAGNOSIS — D1801 Hemangioma of skin and subcutaneous tissue: Secondary | ICD-10-CM | POA: Diagnosis not present

## 2018-04-12 DIAGNOSIS — L570 Actinic keratosis: Secondary | ICD-10-CM | POA: Diagnosis not present

## 2018-04-12 DIAGNOSIS — L821 Other seborrheic keratosis: Secondary | ICD-10-CM | POA: Diagnosis not present

## 2018-05-10 DIAGNOSIS — Z1212 Encounter for screening for malignant neoplasm of rectum: Secondary | ICD-10-CM | POA: Diagnosis not present

## 2018-05-10 DIAGNOSIS — N390 Urinary tract infection, site not specified: Secondary | ICD-10-CM | POA: Diagnosis not present

## 2018-05-10 DIAGNOSIS — Z01419 Encounter for gynecological examination (general) (routine) without abnormal findings: Secondary | ICD-10-CM | POA: Diagnosis not present

## 2018-06-08 DIAGNOSIS — Z23 Encounter for immunization: Secondary | ICD-10-CM | POA: Diagnosis not present

## 2018-06-09 ENCOUNTER — Other Ambulatory Visit: Payer: Self-pay | Admitting: Internal Medicine

## 2018-06-09 DIAGNOSIS — Z1231 Encounter for screening mammogram for malignant neoplasm of breast: Secondary | ICD-10-CM

## 2018-06-29 DIAGNOSIS — R300 Dysuria: Secondary | ICD-10-CM | POA: Diagnosis not present

## 2018-06-29 DIAGNOSIS — R339 Retention of urine, unspecified: Secondary | ICD-10-CM | POA: Diagnosis not present

## 2018-06-29 DIAGNOSIS — N390 Urinary tract infection, site not specified: Secondary | ICD-10-CM | POA: Diagnosis not present

## 2018-06-29 DIAGNOSIS — R358 Other polyuria: Secondary | ICD-10-CM | POA: Diagnosis not present

## 2018-07-07 ENCOUNTER — Ambulatory Visit
Admission: RE | Admit: 2018-07-07 | Discharge: 2018-07-07 | Disposition: A | Discharge status: Home / Self Care | Payer: Medicare Other | Source: Ambulatory Visit | Attending: Internal Medicine | Admitting: Internal Medicine

## 2018-07-07 DIAGNOSIS — Z1231 Encounter for screening mammogram for malignant neoplasm of breast: Secondary | ICD-10-CM

## 2018-08-10 DIAGNOSIS — N390 Urinary tract infection, site not specified: Secondary | ICD-10-CM | POA: Diagnosis not present

## 2018-09-15 HISTORY — PX: BLADDER SUSPENSION: SHX72

## 2018-10-05 DIAGNOSIS — R251 Tremor, unspecified: Secondary | ICD-10-CM | POA: Diagnosis not present

## 2018-10-05 DIAGNOSIS — R5382 Chronic fatigue, unspecified: Secondary | ICD-10-CM | POA: Diagnosis not present

## 2018-11-24 ENCOUNTER — Ambulatory Visit: Payer: PRIVATE HEALTH INSURANCE | Admitting: Neurology

## 2018-12-15 ENCOUNTER — Telehealth: Payer: Self-pay

## 2018-12-15 NOTE — Telephone Encounter (Signed)
Due to current COVID 19 pandemic, our office is severely reducing in office visits for at least the next 2 weeks, in order to minimize the risk to our patients and healthcare providers.   I called pt to offer her a virtual visit (video conference) with Dr. Rexene Alberts at her appt date and time. If pt calls back, please discuss with with. Please also obtain consent. (Pt should understand that although there may be some limitations with this type of visit (we can't complete a thorough physical exam), we will take all precautions to reduce any security or privacy concerns.  Pt should understand that this will be treated like an in office visit and we will file with pt's insurance, and there may be a patient responsible charge related to this service.)

## 2018-12-15 NOTE — Telephone Encounter (Signed)
Pt returned call and was informed. Pt consented to the Virtual Visit and will have her daughter help her set up. E-mail has been added to pts demographics.

## 2018-12-15 NOTE — Telephone Encounter (Signed)
I called pt, she is agreeable to a virtual visit at her appt date and time as scheduled.  Pt understands that although there may be some limitations with this type of visit, we will take all precautions to reduce any security or privacy concerns.  Pt understands that this will be treated like an in office visit and we will file with pt's insurance, and there may be a patient responsible charge related to this service.  Pt's email is [email or web address]. Pt understands that the [employer] webex software must be downloaded and operational on the device pt plans to use for the visit.  Pt has noticed a head tremor for a few months when she is "idle". If she is engaged in a conversation or task she does not notice the head tremor.  Pt's meds, allergies, and PMH were updated.

## 2018-12-21 ENCOUNTER — Encounter: Payer: Self-pay | Admitting: Neurology

## 2018-12-21 ENCOUNTER — Ambulatory Visit (INDEPENDENT_AMBULATORY_CARE_PROVIDER_SITE_OTHER): Payer: Medicare Other | Admitting: Neurology

## 2018-12-21 ENCOUNTER — Other Ambulatory Visit: Payer: Self-pay

## 2018-12-21 ENCOUNTER — Telehealth: Payer: Self-pay | Admitting: Neurology

## 2018-12-21 DIAGNOSIS — G250 Essential tremor: Secondary | ICD-10-CM | POA: Diagnosis not present

## 2018-12-21 NOTE — Patient Instructions (Signed)
Given verbally, during today's virtual video-based encounter, with verbal feedback received.   

## 2018-12-21 NOTE — Telephone Encounter (Signed)
LVM to schedule 4 month follow-up per Dr. Rexene Alberts.

## 2018-12-21 NOTE — Progress Notes (Signed)
Star Age, MD, PhD [facility] [address] [place],  [address]   Virtual Visit via Video Note on 12/21/2018:  I connected with [name] on 12/21/18 at  2:30 PM EDT by a video enabled telemedicine application and verified that I am speaking with the correct person using two identifiers.   I discussed the limitations of evaluation and management by telemedicine and the availability of in person appointments. The patient expressed understanding and agreed to proceed.  History of Present Illness:  [name] is a 73 year old right-handed woman with an underlying medical history of osteoarthritis, diverticulosis, hearing loss, and overweight state, with whom I am conducting a virtual, video based new patient visit via Webex in lieu of a face-to-face visit for evaluation of her head tremor. The patient is unaccompanied today and joins via cell phone and in the beginning of the visit her daughter was there to assist as well. She is referred by her primary care physician, Dr. Merrilee Burns, and I reviewed his office note from 10/05/2018. She had blood work at the time, and I reviewed the results: CBC with differential was unremarkable, other tests included magnesium, TSH and CMP, we will request those results as well.  The patient reports a several month history of intermittent head tremor, her daughter has noticed it for at least a year, particularly when patient watches TV or is at church. It does not particularly bother her but she has had some days where her neck muscles are sore and tired feeling. She takes care of HER-24-year-old grandson at home and does not often carry him but sometimes she just gets exhausted by the end of the day. She has noticed it perhaps and mildly in her hands, not a significant issue at this time. She has noticed that some days are better than others and particularly when she has slept well she has noticed a  exacerbation of her tremor and stress is a trigger as well. She does not drink a lot of caffeine, typically decaf coffee 2 cups in the morning, some milk, some orange juice and estimates that she drinks about 2-3 cups of water per day. Daughter feels that she could do a little better with the water intake. She drinks occasional alcohol, not daily and has not noticed any correlation with alcohol consumption and tremor control. She has 2 biological children, son is 45 years old and has a mild hand tremor, was told he has essential tremor. Her daughter is 26 and has no tremor issues. Patient and Dr. To her first child, she has a total of 3 children. She remembers that one of her paternal aunts had a fairly significant head tremor later in her life. Her father was the oldest of 54 siblings and none of the other aunts and uncles or her dad had a tremor. Father lived to be 19 and mother lived to be 46. She is deaf in her ear from a right-sided cholesteatoma for which she had 3 surgeries several years ago, she has a hearing aid in the left ear. she is widowed and lives alone for the past nearly 6 years.  Her Past Medical History Is Significant For: Past Medical History:  Diagnosis Date   Deafness in right ear    Diverticulitis    Hyperlipidemia     Her Past Surgical History Is Significant For: Past Surgical History:  Procedure Laterality Date   APPENDECTOMY     BREAST CYST ASPIRATION Right  CHOLECYSTECTOMY     MIDDLE EAR SURGERY     TONSILLECTOMY      Her Family History Is Significant For: Family History  Problem Relation Age of Onset   Stroke Mother    Heart disease Mother    Dementia Father    Cancer Maternal Aunt        colon   Cancer Maternal Grandmother        breast   Breast cancer Maternal Grandmother    Breast cancer Cousin     Her Social History Is Significant For: Social History   Socioeconomic History   Marital status: Married    Spouse name: Not on file     Number of children: Not on file   Years of education: Not on file   Highest education level: Not on file  Occupational History   Not on file  Social Needs   Financial resource strain: Not on file   Food insecurity:    Worry: Not on file    Inability: Not on file   Transportation needs:    Medical: Not on file    Non-medical: Not on file  Tobacco Use   Smoking status: Former Smoker    Types: Cigars   Smokeless tobacco: Never Used  Substance and Sexual Activity   Alcohol use: Yes    Comment: social   Drug use: No   Sexual activity: Never  Lifestyle   Physical activity:    Days per week: Not on file    Minutes per session: Not on file   Stress: Not on file  Relationships   Social connections:    Talks on phone: Not on file    Gets together: Not on file    Attends religious service: Not on file    Active member of club or organization: Not on file    Attends meetings of clubs or organizations: Not on file    Relationship status: Not on file  Other Topics Concern   Not on file  Social History Narrative   Not on file    Her Allergies Are:  Allergies  Allergen Reactions   Other Itching and Rash    CSF-HC powder applied to the mastoid cavity results in severe itching and ear drainage.  :   Her Current Medications Are:  Outpatient Encounter Medications as of 12/21/2018  Medication Sig   b complex vitamins tablet Take 1 tablet by mouth daily.   LORazepam (ATIVAN) 1 MG tablet Take 1 mg by mouth at bedtime.   VITAMIN D PO Take by mouth.   No facility-administered encounter medications on file as of 12/21/2018.   :   Review of Systems:  Out of a complete 14 point review of systems, all are reviewed and negative with the exception of these symptoms as listed below:  Observations/Objective:  The most recent available vital signs for my review are from 10/05/2018: Blood pressure 128/78, pulse 85, weight 203 pounds for BMI of 29.97, oxygen saturation  96%.  On examination she is in no acute distress, very pleasant and conversant, turns her left ear to see phone to hear better. She has no obvious head or chin or voice tremor today. Face is symmetric, normal eye blink rate noted, no significant facial masking noted. Extraocular tracking seems preserved. She has no obvious voice tremor, no dysarthria, no hypophonia. She is hard of hearing. On motor examination, she has no drift. Romberg is negative. She walks without difficulty. On upper extremity evaluation, she has a  minimal to mild postural tremor in both upper extremities, left side slightly worse than right, no obvious action tremor, finger to nose is good and very accurate, no intention tremor noted. Fine motor skills with finger taps and hand movements are well preserved bilaterally upon examination today.  Assessment and Plan:  In summary, [name] is a 73 year old right-handed woman with an underlying medical history of osteoarthritis, diverticulosis, hearing loss, and overweight state, with whom I am conducting a virtual, video based new patient visit via Webex in lieu of a face-to-face visit for evaluation of her head tremor. On examination (albeit limited by video eval), she has no obvious signs of parkinsonism and is reassured in that regard. She has a mild or minimal even postural tremor today, with her history, family history and examination supportive of essential tremor. She is advised that her tremor is mild and likely not in need of any symptomatically treatment. She is not keen on taking any prescription medicines. Of note, she takes lorazepam as needed, at night only if she has not slept well for 2-3 nights in a row, she has a 1 mg strength prescription and breaks it in half. She does not tend to overuse it and is advised that she is probably safe to continue to use it this way. I would not favor that she take this more frequently for fear of habituation and side effects. She is  agreeable to using this as needed as she has been. I do not see any urgency and pressing need for a brain scan at this time. She is advised that we should get a more comprehensive physical evaluation done face-to-face when possible and I suggested a follow-up in the clinic in about 4 months for this. We talked about common tremor triggers today including sleep deprivation, suboptimal or dehydration, stress, anxiety and blood sugar fluctuations. She is reminded to stay well-hydrated with water, well rested and pursue stress reduction as best as possible. I answered all her questions today and she was in agreement with the plan.  Star Age, MD, PhD   Follow Up Instructions: 1. Likely mild form of essential tremor.  2. Follow-up in 4 months in face-to-face visit hopefully for a more detailed neurological examination and reevaluation of her tremor. 3. Stay well hydrated, well rested, pursue good nutrition. 4. Call or email for any interim questions or concerns.   I discussed the assessment and treatment plan with the patient. The patient was provided an opportunity to ask questions and all were answered. The patient agreed with the plan and demonstrated an understanding of the instructions.   The patient was advised to call back or seek an in-person evaluation if the symptoms worsen or if the condition fails to improve as anticipated.  I provided 30 minutes of non-face-to-face time during this encounter.   Star Age, MD

## 2019-04-18 ENCOUNTER — Encounter: Payer: Self-pay | Admitting: Neurology

## 2019-04-18 ENCOUNTER — Other Ambulatory Visit: Payer: Self-pay

## 2019-04-18 ENCOUNTER — Ambulatory Visit (INDEPENDENT_AMBULATORY_CARE_PROVIDER_SITE_OTHER): Payer: Medicare Other | Admitting: Neurology

## 2019-04-18 VITALS — BP 125/80 | HR 75 | Ht 69.5 in | Wt 199.0 lb

## 2019-04-18 DIAGNOSIS — R251 Tremor, unspecified: Secondary | ICD-10-CM

## 2019-04-18 NOTE — Progress Notes (Signed)
Subjective:    Patient ID: Kelly Burns is a 73 y.o. female.  HPI     Interim history:   [name] is a 73 year old right-handed woman with an underlying medical history of osteoarthritis, diverticulosis, hearing loss, and overweight state, who presents for follow-up consultation of her tremor.  The patient is accompanied by her daughter today.  I first met her in virtual visit on 12/21/2018 at the request of her primary care physician, at which time she reported a several month history of intermittent head tremor.  On examination she had a mild postural hand tremor, no obvious head tremor, no obvious signs of parkinsonism.  She was advised to follow-up for reevaluation, she was taking Ativan 1 mg strength as needed, mostly for sleep.  Today, 04/18/2019:   She reports that her head tremor is stable.  She feels that sometimes her neck feels tired and it is slightly harder for her to turn her head to the left as opposed to the right.  She does not have a daily tremor but her daughter has noticed it more consistently, in the past more intermittently but more recently in the past few months more consistently.  She takes Ativan half a pill at night as needed, sometimes she does not take it all week and sometimes a couple of times a week.  She has been noted to snore.  She has rarely woken up with a sense of gasping for air and in a panic and felt it was a panic attack.  She does endorse some stressors in the recent few years.  She has not noticed any other new symptoms, her balance is not great but this is not a new phenomenon and she has not fallen thankfully she has noticed some left arm achiness at times, irrespective of her neck discomfort.   The patient's allergies, current medications, family history, past medical history, past social history, past surgical history and problem list were reviewed and updated as appropriate.   Previously:   12/21/2018: I am conducting a virtual, video based  new patient visit via Webex in lieu of a face-to-face visit for evaluation of her head tremor. The patient is unaccompanied today and joins via cell phone and in the beginning of the visit her daughter was there to assist as well. She is referred by her primary care physician, Dr. Merrilee Seashore, and I reviewed his office note from 10/05/2018. She had blood work at the time, and I reviewed the results: CBC with differential was unremarkable, other tests included magnesium, TSH and CMP, we will request those results as well.  The patient reports a several month history of intermittent head tremor, her daughter has noticed it for at least a year, particularly when patient watches TV or is at church. It does not particularly bother her but she has had some days where her neck muscles are sore and tired feeling. She takes care of her 16-year-old grandson at home and does not often carry him but sometimes she just gets exhausted by the end of the day. She has noticed it perhaps and mildly in her hands, not a significant issue at this time. She has noticed that some days are better than others and particularly when she has slept well she has noticed a exacerbation of her tremor and stress is a trigger as well. She does not drink a lot of caffeine, typically decaf coffee 2 cups in the morning, some milk, some orange juice and estimates that she drinks  about 2-3 cups of water per day. Daughter feels that she could do a little better with the water intake. She drinks occasional alcohol, not daily and has not noticed any correlation with alcohol consumption and tremor control. She has 2 biological children, son is 54 years old and has a mild hand tremor, was told he has essential tremor. Her daughter is 89 and has no tremor issues. She has a total of 3 children. She remembers that one of her paternal aunts had a fairly significant head tremor later in her life. Her father was the oldest of 4 siblings and none of the  other aunts and uncles or her dad had a tremor. Father lived to be 49 and mother lived to be 35. She is deaf in her ear from a right-sided cholesteatoma for which she had 3 surgeries several years ago, she has a hearing aid in the left ear. she is widowed and lives alone for the past nearly 6 years.   Her Past Medical History Is Significant For: Past Medical History:  Diagnosis Date  . Deafness in right ear   . Diverticulitis   . Hyperlipidemia     Her Past Surgical History Is Significant For: Past Surgical History:  Procedure Laterality Date  . APPENDECTOMY    . BREAST CYST ASPIRATION Right   . CHOLECYSTECTOMY    . MIDDLE EAR SURGERY    . TONSILLECTOMY      Her Family History Is Significant For: Family History  Problem Relation Age of Onset  . Stroke Mother   . Heart disease Mother   . Dementia Father   . Cancer Maternal Aunt        colon  . Cancer Maternal Grandmother        breast  . Breast cancer Maternal Grandmother   . Breast cancer Cousin     Her Social History Is Significant For: Social History   Socioeconomic History  . Marital status: Married    Spouse name: Not on file  . Number of children: Not on file  . Years of education: Not on file  . Highest education level: Not on file  Occupational History  . Not on file  Social Needs  . Financial resource strain: Not on file  . Food insecurity    Worry: Not on file    Inability: Not on file  . Transportation needs    Medical: Not on file    Non-medical: Not on file  Tobacco Use  . Smoking status: Former Smoker    Types: Cigars  . Smokeless tobacco: Never Used  Substance and Sexual Activity  . Alcohol use: Yes    Comment: social  . Drug use: No  . Sexual activity: Never  Lifestyle  . Physical activity    Days per week: Not on file    Minutes per session: Not on file  . Stress: Not on file  Relationships  . Social Herbalist on phone: Not on file    Gets together: Not on file     Attends religious service: Not on file    Active member of club or organization: Not on file    Attends meetings of clubs or organizations: Not on file    Relationship status: Not on file  Other Topics Concern  . Not on file  Social History Narrative  . Not on file    Her Allergies Are:  Allergies  Allergen Reactions  . Other Itching and Rash  CSF-HC powder applied to the mastoid cavity results in severe itching and ear drainage.  :   Her Current Medications Are:  Outpatient Encounter Medications as of 04/18/2019  Medication Sig  . b complex vitamins tablet Take 1 tablet by mouth daily.  Marland Kitchen CALCIUM PO Take by mouth.  . estradiol (ESTRACE) 0.1 MG/GM vaginal cream Place 1 Applicatorful vaginally at bedtime.  Marland Kitchen LORazepam (ATIVAN) 1 MG tablet Take 1 mg by mouth at bedtime.  . Multiple Vitamin (MULTIVITAMIN) tablet Take 1 tablet by mouth daily.  . Probiotic Product (ALIGN PO) Take by mouth.  Marland Kitchen VITAMIN D PO Take by mouth.   No facility-administered encounter medications on file as of 04/18/2019.   :  Review of Systems:  Out of a complete 14 point review of systems, all are reviewed and negative with the exception of these symptoms as listed below:  Review of Systems  Neurological:       Pt presents today to follow up on her tremor. Pt feels that her tremor is stable. Pt's daughter notices her tremor daily.    Objective:  Neurological Exam  Physical Exam Physical Examination:   Vitals:   04/18/19 1511  BP: 125/80  Pulse: 75    General Examination: The patient is a very pleasant 73 y.o. female in no acute distress. She appears well-developed and well-nourished and well groomed.   HEENT: Normocephalic, atraumatic, pupils are equal, round and reactive to light and accommodation. Extraocular tracking is well preserved.  Neck is supple.  Face is symmetric with normal facial animation, no hypophonia or voice tremor, there is a slight intermittent side to side head tremor at  times, not very consistent, no lip, neck or jaw tremor otherwise.  She has normal shoulder strength and equal shoulder height.  Chest: Clear to auscultation without wheezing, rhonchi or crackles noted.  Heart: S1+S2+0, regular and normal without murmurs, rubs or gallops noted.   Abdomen: Soft, non-tender and non-distended with normal bowel sounds appreciated on auscultation.  Extremities: There is no pitting edema in the distal lower extremities bilaterally.  Skin: Warm and dry without trophic changes noted.  Musculoskeletal: exam reveals no obvious joint deformities, tenderness or joint swelling or erythema.   Neurologically:  Mental status: The patient is awake, alert and oriented in all 4 spheres. Her immediate and remote memory, attention, language skills and fund of knowledge are appropriate. There is no evidence of aphasia, agnosia, apraxia or anomia. Speech is clear with normal prosody and enunciation. Thought process is linear. Mood is normal and affect is normal.  Cranial nerves II - XII are as described above under HEENT exam. In addition: shoulder shrug is normal with equal shoulder height noted. Motor exam: Normal bulk, strength and tone is noted. There is no drift, resting tremor. Reflexes are 1+ throughout. She has a slight left hand postural tremor, no significant action tremor, no intention tremor.   Fine motor skills and coordination: intact with normal finger taps, normal hand movements, normal rapid alternating patting, normal foot taps and normal foot agility.  Cerebellar testing: No dysmetria or intention tremor on finger to nose testing. Heel to shin is unremarkable bilaterally. There is no truncal or gait ataxia.  Sensory exam: intact to light touch.  Gait, station and balance: She stands easily. No veering to one side is noted. No leaning to one side is noted. Posture is age-appropriate and stance is narrow based. Gait shows normal stride length and normal pace. No  problems turning are noted.  Assessment and Plan:  In summary, Ms. Candia Kingsbury is a 72 year old right-handed woman with an underlying medical history of osteoarthritis, diverticulosis, hearing loss, and overweight state, Who presents for follow-up consultation of her head tremor.  On examination, she has a very slight intermittent head tremor, very slight left hand tremor with posture only, no obvious signs of parkinsonism and she is largely reassured in that regard.  She does give a history in her family of tremors, her son has hand tremors and father had a tremor, 1 aunt had a tremor.  She has most likely a mild form of essential tremor, we can continue to monitor his symptoms, she is not keen on any symptomatic medications.  I talked to her today regarding potentially utilizing symptomatic treatment in the form of a beta-blocker such as propranolol or primidone/Mysoline down the road if needed.  For now, we mutually agreed to continue to monitor her symptoms and her exam.  She has had some sleep difficulty from time to time, she still uses Ativan low-dose as needed.  She is advised to look for any symptoms for sleep apnea.  We can always consider a sleep study.  She is encouraged to think about it.  Her daughter is more proactive and would like to pursue a sleep study if possible but the patient would like to think about it.  She is advised to call our office should she decide to pursue sleep study testing.  Her daughter reports that she has noticed her mom's snoring.  At this juncture, we plan to reconvene in about 6 months, sooner if needed.  I answered all their questions today and the patient and her daughter were in agreement I spent 25 minutes in total face-to-face time with the patient, more than 50% of which was spent in counseling and coordination of care, reviewing test results, reviewing medication and discussing or reviewing the diagnosis of tremor, its prognosis and treatment options. Pertinent  laboratory and imaging test results that were available during this visit with the patient were reviewed by me and considered in my medical decision making (see chart for details).

## 2019-04-18 NOTE — Patient Instructions (Signed)
You have a mild and intermittent tremor of the head and neck area and very slight in the left hand today. I do not see any signs or symptoms of parkinson's like disease or what we call parkinsonism.   For your tremor, I would not recommend any new medication for fear of side effects (especially sleepiness) or medication interactions, especially in light of the mild and intermittent nature.   We can recheck your exam in 6 months.  Please remember, that any kind of tremor may be exacerbated by anxiety, anger, nervousness, excitement, dehydration, sleep deprivation, by caffeine, and low blood sugar values or blood sugar fluctuations.   Please monitor your symptoms for sleep difficulties and sleep apnea. If you have breathing related issues in your sleep, such as: snorting sounds, choking sounds, pauses in your breathing or shallow breathing events, these may be symptoms of obstructive sleep apnea (OSA). We can consider a sleep study.

## 2019-05-04 DIAGNOSIS — M159 Polyosteoarthritis, unspecified: Secondary | ICD-10-CM | POA: Diagnosis not present

## 2019-05-04 DIAGNOSIS — Z0000 Encounter for general adult medical examination without abnormal findings: Secondary | ICD-10-CM | POA: Diagnosis not present

## 2019-05-04 DIAGNOSIS — R5382 Chronic fatigue, unspecified: Secondary | ICD-10-CM | POA: Diagnosis not present

## 2019-05-04 DIAGNOSIS — Z7189 Other specified counseling: Secondary | ICD-10-CM | POA: Diagnosis not present

## 2019-05-17 ENCOUNTER — Other Ambulatory Visit: Payer: Self-pay | Admitting: Internal Medicine

## 2019-05-17 DIAGNOSIS — Z23 Encounter for immunization: Secondary | ICD-10-CM | POA: Diagnosis not present

## 2019-05-17 DIAGNOSIS — K5730 Diverticulosis of large intestine without perforation or abscess without bleeding: Secondary | ICD-10-CM | POA: Diagnosis not present

## 2019-05-17 DIAGNOSIS — R5382 Chronic fatigue, unspecified: Secondary | ICD-10-CM | POA: Diagnosis not present

## 2019-05-17 DIAGNOSIS — M159 Polyosteoarthritis, unspecified: Secondary | ICD-10-CM | POA: Diagnosis not present

## 2019-05-17 DIAGNOSIS — Z0000 Encounter for general adult medical examination without abnormal findings: Secondary | ICD-10-CM | POA: Diagnosis not present

## 2019-05-17 DIAGNOSIS — Z1231 Encounter for screening mammogram for malignant neoplasm of breast: Secondary | ICD-10-CM

## 2019-06-10 DIAGNOSIS — J0100 Acute maxillary sinusitis, unspecified: Secondary | ICD-10-CM | POA: Diagnosis not present

## 2019-06-10 DIAGNOSIS — K047 Periapical abscess without sinus: Secondary | ICD-10-CM | POA: Diagnosis not present

## 2019-07-05 DIAGNOSIS — N390 Urinary tract infection, site not specified: Secondary | ICD-10-CM | POA: Diagnosis not present

## 2019-07-11 ENCOUNTER — Ambulatory Visit
Admission: RE | Admit: 2019-07-11 | Discharge: 2019-07-11 | Disposition: A | Discharge status: Home / Self Care | Payer: Medicare Other | Source: Ambulatory Visit | Attending: Internal Medicine | Admitting: Internal Medicine

## 2019-07-11 ENCOUNTER — Other Ambulatory Visit: Payer: Self-pay

## 2019-07-11 DIAGNOSIS — Z1231 Encounter for screening mammogram for malignant neoplasm of breast: Secondary | ICD-10-CM | POA: Diagnosis not present

## 2019-08-09 DIAGNOSIS — J0100 Acute maxillary sinusitis, unspecified: Secondary | ICD-10-CM | POA: Diagnosis not present

## 2019-09-23 DIAGNOSIS — L309 Dermatitis, unspecified: Secondary | ICD-10-CM | POA: Diagnosis not present

## 2019-10-19 ENCOUNTER — Ambulatory Visit: Payer: Medicare Other | Admitting: Neurology

## 2019-10-19 ENCOUNTER — Encounter: Payer: Self-pay | Admitting: Neurology

## 2019-10-19 ENCOUNTER — Other Ambulatory Visit: Payer: Self-pay

## 2019-10-19 VITALS — BP 122/80 | HR 80 | Temp 97.5°F | Ht 69.0 in | Wt 196.0 lb

## 2019-10-19 DIAGNOSIS — G250 Essential tremor: Secondary | ICD-10-CM | POA: Diagnosis not present

## 2019-10-19 NOTE — Progress Notes (Signed)
Subjective:    Patient ID: Kelly Burns is a 74 y.o. female.  HPI     Interim history:   Kelly Burns is a 74 year old right-handed woman with an underlying medical history of osteoarthritis, diverticulosis, hearing loss, and overweight state, who presents for follow-up consultation of Kelly tremor.  The patient is unaccompanied today. I last saw Kelly on 04/18/2019, at which time she felt Kelly head tremor was stable.  She had no signs of parkinsonism.  She was advised to follow-up routinely for recheck in 6 months, I did not suggest that she start any symptomatic treatment for Kelly tremor.  We did talk about potential symptomatic treatment options such as a beta-blocker or Mysoline down the road.   Today, 10/19/2019: She reports feeling stable, does not feel the need to address tremor with symptomatic medication.  She notices that with stress and with fatigue or tiredness the tremor flares up.  Kelly Burns also feels that it is stable.  She specifically asked Kelly Burns about it in preparation for the visit.  Patient tries to hydrate well.  She got the first Covid shot a couple of weeks ago and is scheduled next week for second shot.  She takes Kelly Ativan very sparingly at night for sleep, maybe twice a month.  The patient's allergies, current medications, family history, past medical history, past social history, past surgical history and problem list were reviewed and updated as appropriate.    Previously:       I first met Kelly in virtual visit on 12/21/2018 at the request of Kelly primary care physician, at which time she reported a several month history of intermittent head tremor.  On examination she had a mild postural hand tremor, no obvious head tremor, no obvious signs of parkinsonism.  She was advised to follow-up for reevaluation, she was taking Ativan 1 mg strength as needed, mostly for sleep.   12/21/2018: I am conducting a virtual, video based new patient visit via Webex in lieu of a  face-to-face visit for evaluation of Kelly head tremor. The patient is unaccompanied today and joins via cell phone and in the beginning of the visit Kelly Burns was there to assist as well. She is referred by Kelly primary care physician, Dr. Merrilee Seashore, and I reviewed his office note from 10/05/2018. She had blood work at the time, and I reviewed the results: CBC with differential was unremarkable, other tests included magnesium, TSH and CMP, we will request those results as well.  The patient reports a several month history of intermittent head tremor, Kelly Burns has noticed it for at least a year, particularly when patient watches TV or is at church. It does not particularly bother Kelly but she has had some days where Kelly neck muscles are sore and tired feeling. She takes care of Kelly 4-year-old grandson at home and does not often carry him but sometimes she just gets exhausted by the end of the day. She has noticed it perhaps and mildly in Kelly hands, not a significant issue at this time. She has noticed that some days are better than others and particularly when she has slept well she has noticed a exacerbation of Kelly tremor and stress is a trigger as well. She does not drink a lot of caffeine, typically decaf coffee 2 cups in the morning, some milk, some orange juice and estimates that she drinks about 2-3 cups of water per day. Burns feels that she could do a little better with  the water intake. She drinks occasional alcohol, not daily and has not noticed any correlation with alcohol consumption and tremor control. She has 2 biological children, son is 67 years old and has a mild hand tremor, was told he has essential tremor. Kelly Burns is 22 and has no tremor issues. She has a total of 3 children. She remembers that one of Kelly paternal aunts had a fairly significant head tremor later in Kelly life. Kelly father was the oldest of 42 siblings and none of the other aunts and uncles or Kelly dad had a  tremor. Father lived to be 5 and mother lived to be 64. She is deaf in Kelly ear from a right-sided cholesteatoma for which she had 3 surgeries several years ago, she has a hearing aid in the left ear. she is widowed and lives alone for the past nearly 6 years.   Kelly Past Medical History Is Significant For: Past Medical History:  Diagnosis Date  . Deafness in right ear   . Diverticulitis   . Hyperlipidemia     Kelly Past Surgical History Is Significant For: Past Surgical History:  Procedure Laterality Date  . APPENDECTOMY    . BREAST BIOPSY Right   . CHOLECYSTECTOMY    . MIDDLE EAR SURGERY    . TONSILLECTOMY      Kelly Family History Is Significant For: Family History  Problem Relation Age of Onset  . Stroke Mother   . Heart disease Mother   . Dementia Father   . Cancer Maternal Aunt        colon  . Cancer Maternal Grandmother        breast  . Breast cancer Maternal Grandmother   . Breast cancer Cousin     Kelly Social History Is Significant For: Social History   Socioeconomic History  . Marital status: Widowed    Spouse name: Not on file  . Number of children: 3  . Years of education: Not on file  . Highest education level: Some college, no degree  Occupational History  . Not on file  Tobacco Use  . Smoking status: Former Smoker    Types: Cigars  . Smokeless tobacco: Never Used  Substance and Sexual Activity  . Alcohol use: Yes    Comment: social  . Drug use: No  . Sexual activity: Never  Other Topics Concern  . Not on file  Social History Narrative   Lives at home alone    Right handed   Caffeine: decaf coffee 2 cups/day   Social Determinants of Health   Financial Resource Strain:   . Difficulty of Paying Living Expenses: Not on file  Food Insecurity:   . Worried About Charity fundraiser in the Last Year: Not on file  . Ran Out of Food in the Last Year: Not on file  Transportation Needs:   . Lack of Transportation (Medical): Not on file  . Lack of  Transportation (Non-Medical): Not on file  Physical Activity:   . Days of Exercise per Week: Not on file  . Minutes of Exercise per Session: Not on file  Stress:   . Feeling of Stress : Not on file  Social Connections:   . Frequency of Communication with Friends and Family: Not on file  . Frequency of Social Gatherings with Friends and Family: Not on file  . Attends Religious Services: Not on file  . Active Member of Clubs or Organizations: Not on file  . Attends Archivist  Meetings: Not on file  . Marital Status: Not on file    Kelly Allergies Are:  Allergies  Allergen Reactions  . Other Itching and Rash    CSF-HC powder applied to the mastoid cavity results in severe itching and ear drainage.  :   Kelly Current Medications Are:  Outpatient Encounter Medications as of 10/19/2019  Medication Sig  . b complex vitamins tablet Take 1 tablet by mouth daily.  Marland Kitchen CALCIUM PO Take by mouth.  . estradiol (ESTRACE) 0.1 MG/GM vaginal cream Place 1 Applicatorful vaginally at bedtime.  Marland Kitchen LORazepam (ATIVAN) 1 MG tablet Take 1 mg by mouth at bedtime as needed for sleep.   . Multiple Vitamin (MULTIVITAMIN) tablet Take 1 tablet by mouth daily.  . Probiotic Product (ALIGN PO) Take by mouth.  Marland Kitchen VITAMIN D PO Take by mouth.   No facility-administered encounter medications on file as of 10/19/2019.  :  Review of Systems:  Out of a complete 14 point review of systems, all are reviewed and negative with the exception of these symptoms as listed below: Review of Systems  Neurological:       Patient is here for follow-up for Kelly tremor. She does not think it has gotten worse. She notices it when she's tired.     Objective:  Neurological Exam  Physical Exam Physical Examination:   Vitals:   10/19/19 1355  BP: 122/80  Pulse: 80  Temp: (!) 97.5 F (36.4 C)    General Examination: The patient is a very pleasant 74 y.o. female in no acute distress. She appears well-developed and  well-nourished and well groomed.   HEENT: Normocephalic, atraumatic, pupils are equal, round and reactive to light and accommodation. Extraocular tracking is well preserved.  Neck is supple.  Face is symmetric with normal facial animation, no hypophonia or voice tremor, there is a slight intermittent side to side head tremor at times, stable. No lip, neck or jaw tremor otherwise.  She has normal shoulder strength and equal shoulder height.  Chest: Clear to auscultation without wheezing, rhonchi or crackles noted.  Heart: S1+S2+0, regular and normal without murmurs, rubs or gallops noted.   Abdomen: Soft, non-tender and non-distended with normal bowel sounds appreciated on auscultation.  Extremities: There is no pitting edema in the distal lower extremities bilaterally.  Skin: Warm and dry without trophic changes noted.  Musculoskeletal: exam reveals no obvious joint deformities, tenderness or joint swelling or erythema.   Neurologically:  Mental status: The patient is awake, alert and oriented in all 4 spheres. Kelly immediate and remote memory, attention, language skills and fund of knowledge are appropriate. There is no evidence of aphasia, agnosia, apraxia or anomia. Speech is clear with normal prosody and enunciation. Thought process is linear. Mood is normal and affect is normal.  Cranial nerves II - XII are as described above under HEENT exam. In addition: shoulder shrug is normal with equal shoulder height noted. Motor exam: Normal bulk, strength and tone is noted. There is no drift, or resting tremor. Reflexes are 1+ throughout. She has a slight left more than right hand postural tremor, no significant action tremor, no intention tremor.   Fine motor skills and coordination: intact and stable.  Cerebellar testing: No dysmetria or intention tremor. There is no truncal or gait ataxia.  Sensory exam: intact to light touch.  Gait, station and balance: She stands easily. No veering  to one side is noted. No leaning to one side is noted. Posture is age-appropriate and stance  is narrow based. Gait shows normal stride length and normal pace. No problems turning are noted. Tandem walk is good.   Assessment and Plan:  In summary,[name] is a 74 year old right-handed woman with an underlying medical history of osteoarthritis, diverticulosis, hearing loss, and overweight state, Who presents for follow-up consultation of Kelly Tremors, she has a stable head tremor and a very mild hand tremor, she has felt stable.  She does not feel there is need for symptomatic medication.  We mutually agreed to continue to monitor Kelly symptoms and examination.  She has no signs of parkinsonism.  We talked about utilizing symptomatic Medication down the road, we talked about potentially utilizing a beta-blocker or primidone if need be.  For now, we will monitor Kelly on a 6 monthly basis.  She is advised to follow-up routinely in 6 months, sooner if needed.  She is reminded to stay well-hydrated and well rested.  We talked about tremor triggers again today.  She is advised to call with any interim questions or concerns.  I answered all Kelly questions today and she was in agreement.  I spent 20 minutes in total face-to-face and in reviewing records during pre-charting, more than 50% of which was spent in counseling and coordination of care, reviewing test results, reviewing medication and discussing or reviewing the diagnosis of tremor, the prognosis and treatment options. Pertinent laboratory and imaging test results that were available during this visit with the patient were reviewed by me and considered in my medical decision making (see chart for details).

## 2019-10-19 NOTE — Patient Instructions (Signed)
Your tremor is stable, still on the mild side.  We can continue to monitor.  Please follow-up routinely in 6 months, sooner if needed.

## 2020-02-14 DIAGNOSIS — L814 Other melanin hyperpigmentation: Secondary | ICD-10-CM | POA: Diagnosis not present

## 2020-02-14 DIAGNOSIS — L578 Other skin changes due to chronic exposure to nonionizing radiation: Secondary | ICD-10-CM | POA: Diagnosis not present

## 2020-02-14 DIAGNOSIS — L918 Other hypertrophic disorders of the skin: Secondary | ICD-10-CM | POA: Diagnosis not present

## 2020-02-14 DIAGNOSIS — L821 Other seborrheic keratosis: Secondary | ICD-10-CM | POA: Diagnosis not present

## 2020-02-14 DIAGNOSIS — L219 Seborrheic dermatitis, unspecified: Secondary | ICD-10-CM | POA: Diagnosis not present

## 2020-04-18 ENCOUNTER — Ambulatory Visit: Payer: Medicare Other | Admitting: Neurology

## 2020-04-19 DIAGNOSIS — L0293 Carbuncle, unspecified: Secondary | ICD-10-CM | POA: Diagnosis not present

## 2020-05-10 ENCOUNTER — Ambulatory Visit: Payer: Medicare Other | Admitting: Neurology

## 2020-05-15 DIAGNOSIS — E7800 Pure hypercholesterolemia, unspecified: Secondary | ICD-10-CM | POA: Diagnosis not present

## 2020-05-15 DIAGNOSIS — R5382 Chronic fatigue, unspecified: Secondary | ICD-10-CM | POA: Diagnosis not present

## 2020-05-15 DIAGNOSIS — N390 Urinary tract infection, site not specified: Secondary | ICD-10-CM | POA: Diagnosis not present

## 2020-05-15 DIAGNOSIS — Z0000 Encounter for general adult medical examination without abnormal findings: Secondary | ICD-10-CM | POA: Diagnosis not present

## 2020-05-15 DIAGNOSIS — Z79899 Other long term (current) drug therapy: Secondary | ICD-10-CM | POA: Diagnosis not present

## 2020-05-15 DIAGNOSIS — M810 Age-related osteoporosis without current pathological fracture: Secondary | ICD-10-CM | POA: Diagnosis not present

## 2020-05-22 DIAGNOSIS — M159 Polyosteoarthritis, unspecified: Secondary | ICD-10-CM | POA: Diagnosis not present

## 2020-05-22 DIAGNOSIS — K5730 Diverticulosis of large intestine without perforation or abscess without bleeding: Secondary | ICD-10-CM | POA: Diagnosis not present

## 2020-05-22 DIAGNOSIS — I872 Venous insufficiency (chronic) (peripheral): Secondary | ICD-10-CM | POA: Diagnosis not present

## 2020-05-22 DIAGNOSIS — M810 Age-related osteoporosis without current pathological fracture: Secondary | ICD-10-CM | POA: Diagnosis not present

## 2020-05-22 DIAGNOSIS — Z0000 Encounter for general adult medical examination without abnormal findings: Secondary | ICD-10-CM | POA: Diagnosis not present

## 2020-05-22 DIAGNOSIS — K580 Irritable bowel syndrome with diarrhea: Secondary | ICD-10-CM | POA: Diagnosis not present

## 2020-06-01 DIAGNOSIS — Z23 Encounter for immunization: Secondary | ICD-10-CM | POA: Diagnosis not present

## 2020-06-27 ENCOUNTER — Encounter: Payer: Self-pay | Admitting: Neurology

## 2020-06-27 ENCOUNTER — Other Ambulatory Visit: Payer: Self-pay

## 2020-06-27 ENCOUNTER — Ambulatory Visit: Payer: Medicare Other | Admitting: Neurology

## 2020-06-27 VITALS — BP 118/80 | HR 105 | Ht 70.0 in | Wt 199.5 lb

## 2020-06-27 DIAGNOSIS — G250 Essential tremor: Secondary | ICD-10-CM

## 2020-06-27 NOTE — Patient Instructions (Signed)
You look well, your tremor is rather stable.  I do not see any significant change from last examination.   You can try Melatonin at night for sleep: take 1 mg to 3 mg, one to 2 hours before your bedtime. You can go up to 5 mg if needed. It is over the counter and comes in pill form, chewable form and spray, if you prefer.    I think you are stable enough for a 1 year checkup.  Please continue to try to exercise on a regular basis and stay well-hydrated, well rested.

## 2020-06-27 NOTE — Progress Notes (Signed)
Subjective:    Patient ID: Kelly Burns is a 74 y.o. female.  HPI     Interim history:   Kelly Burns is a 74 year old right-handed woman with an underlying medical history of osteoarthritis, diverticulosis, hearing loss, and overweight state, who presents for follow-up consultation of her tremor.  The patient is unaccompanied today. I last saw her on 10/19/2019, at which time she felt stable, did not feel that she needed symptomatic treatment for her tremor.  We talked about different options down the road.  We mutually agreed to continue to monitor her symptoms and examination.  Today, 06/27/2020: She reports feeling stable, no significant exacerbation of her tremor.  She has noticed that her tremor is more noticeable if she has not slept well.  She has had some difficulty sleeping, sometimes she takes 1/2 mg lorazepam.  She has a prescription for it from her primary care physician, 1 mg strength but does not take a whole pill typically.  She has not had any falls.  She does feel that her balance has not been as good over time.  She recently had a bone density test that was felt to be at higher risk for fracture, she has increased her calcium intake.  She also takes vitamin D.  She tries to exercise.  She wants to lose some weight.  She tries to hydrate well with water and limits her caffeine intake, typically drinks decaf coffee.      The patient's allergies, current medications, family history, past medical history, past social history, past surgical history and problem list were reviewed and updated as appropriate.    Previously:    I saw her on 04/18/2019, at which time she felt her head tremor was stable.  She had no signs of parkinsonism.  She was advised to follow-up routinely for recheck in 6 months, I did not suggest that she start any symptomatic treatment for her tremor.  We did talk about potential symptomatic treatment options such as a beta-blocker or Mysoline down the road.       I first met her in virtual visit on 12/21/2018 at the request of her primary care physician, at which time she reported a several month history of intermittent head tremor.  On examination she had a mild postural hand tremor, no obvious head tremor, no obvious signs of parkinsonism.  She was advised to follow-up for reevaluation, she was taking Ativan 1 mg strength as needed, mostly for sleep.   12/21/2018: I am conducting a virtual, video based new patient visit via Webex in lieu of a face-to-face visit for evaluation of her head tremor. The patient is unaccompanied today and joins via cell phone and in the beginning of the visit her daughter was there to assist as well. She is referred by her primary care physician, Dr. Merrilee Seashore, and I reviewed his office note from 10/05/2018. She had blood work at the time, and I reviewed the results: CBC with differential was unremarkable, other tests included magnesium, TSH and CMP, we will request those results as well.  The patient reports a several month history of intermittent head tremor, her daughter has noticed it for at least a year, particularly when patient watches TV or is at church. It does not particularly bother her but she has had some days where her neck muscles are sore and tired feeling. She takes care of her 37-year-old grandson at home and does not often carry him but sometimes she just gets exhausted by  the end of the day. She has noticed it perhaps and mildly in her hands, not a significant issue at this time. She has noticed that some days are better than others and particularly when she has slept well she has noticed a exacerbation of her tremor and stress is a trigger as well. She does not drink a lot of caffeine, typically decaf coffee 2 cups in the morning, some milk, some orange juice and estimates that she drinks about 2-3 cups of water per day. Daughter feels that she could do a little better with the water intake. She drinks  occasional alcohol, not daily and has not noticed any correlation with alcohol consumption and tremor control. She has 2 biological children, son is 64 years old and has a mild hand tremor, was told he has essential tremor. Her daughter is 68 and has no tremor issues. She has a total of 3 children. She remembers that one of her paternal aunts had a fairly significant head tremor later in her life. Her father was the oldest of 75 siblings and none of the other aunts and uncles or her dad had a tremor. Father lived to be 40 and mother lived to be 34. She is deaf in her ear from a right-sided cholesteatoma for which she had 3 surgeries several years ago, she has a hearing aid in the left ear. she is widowed and lives alone for the past nearly 6 years.   Her Past Medical History Is Significant For: Past Medical History:  Diagnosis Date   Deafness in right ear    Diverticulitis    Hyperlipidemia     Her Past Surgical History Is Significant For: Past Surgical History:  Procedure Laterality Date   APPENDECTOMY     BREAST BIOPSY Right    CHOLECYSTECTOMY     MIDDLE EAR SURGERY     TONSILLECTOMY      Her Family History Is Significant For: Family History  Problem Relation Age of Onset   Stroke Mother    Heart disease Mother    Dementia Father    Cancer Maternal Aunt        colon   Cancer Maternal Grandmother        breast   Breast cancer Maternal Grandmother    Breast cancer Cousin     Her Social History Is Significant For: Social History   Socioeconomic History   Marital status: Widowed    Spouse name: Not on file   Number of children: 3   Years of education: Not on file   Highest education level: Some college, no degree  Occupational History   Not on file  Tobacco Use   Smoking status: Former Smoker    Types: Cigars   Smokeless tobacco: Never Used  Scientific laboratory technician Use: Never used  Substance and Sexual Activity   Alcohol use: Yes    Comment:  social   Drug use: No   Sexual activity: Never  Other Topics Concern   Not on file  Social History Narrative   Lives at home alone    Right handed   Caffeine: decaf coffee 2 cups/day   Social Determinants of Health   Financial Resource Strain:    Difficulty of Paying Living Expenses: Not on file  Food Insecurity:    Worried About Running Out of Food in the Last Year: Not on file   [employer] of Food in the Last Year: Not on file  Transportation Needs:  Lack of Transportation (Medical): Not on file   Lack of Transportation (Non-Medical): Not on file  Physical Activity:    Days of Exercise per Week: Not on file   Minutes of Exercise per Session: Not on file  Stress:    Feeling of Stress : Not on file  Social Connections:    Frequency of Communication with Friends and Family: Not on file   Frequency of Social Gatherings with Friends and Family: Not on file   Attends Religious Services: Not on file   Active Member of Clubs or Organizations: Not on file   Attends Archivist Meetings: Not on file   Marital Status: Not on file    Her Allergies Are:  Allergies  Allergen Reactions   Other Itching and Rash    CSF-HC powder applied to the mastoid cavity results in severe itching and ear drainage.  :   Her Current Medications Are:  Outpatient Encounter Medications as of 06/27/2020  Medication Sig   b complex vitamins tablet Take 1 tablet by mouth daily.   CALCIUM PO Take by mouth.   estradiol (ESTRACE) 0.1 MG/GM vaginal cream Place 1 Applicatorful vaginally at bedtime.   LORazepam (ATIVAN) 1 MG tablet Take 1 mg by mouth at bedtime as needed for sleep.    Multiple Vitamin (MULTIVITAMIN) tablet Take 1 tablet by mouth daily.   Probiotic Product (ALIGN PO) Take by mouth.   VITAMIN D PO Take 3,000 mg by mouth.    No facility-administered encounter medications on file as of 06/27/2020.  :  Review of Systems:  Out of a complete 14 point review of  systems, all are reviewed and negative with the exception of these symptoms as listed below: Review of Systems  Neurological:       Here for 6 month f/u on tremors. Pt reports she has been doing fairly well. Tremor most pronounced when she is tried. Would like to discuss the option of following up yearly instead of every 6 months.    Objective:  Neurological Exam  Physical Exam Physical Examination:   Vitals:   06/27/20 1256  BP: 118/80  Pulse: (!) 105    General Examination: The patient is a very pleasant 74 y.o. female in no acute distress. She appears well-developed and well-nourished and well groomed.   HEENT:Normocephalic, atraumatic, pupils are equal, round and reactive to light and accommodation. Extraocular tracking is well preserved. Neck is supple.  Slight increase in cervical kyphosis noted.  She reports that her father had a slight hump in the neck later in life. Face is symmetric with normal facial animation, no hypophonia or voice tremor, but slight hoarseness at times there is a slight intermittent side to side head tremor at times, stable. No lip, neck or jaw tremor otherwise. She has normal shoulder strength and equal shoulder height.  Hearing is impaired on the right, left hearing aid noted.  Chest:Clear to auscultation without wheezing, rhonchi or crackles noted.  Heart:S1+S2+0, regular and normal without murmurs, rubs or gallops noted.   Abdomen:Soft, non-tender and non-distended with normal bowel sounds appreciated on auscultation.  Extremities:There isnopitting edema in the distal lower extremities bilaterally.  Bilateral compression socks up to the knees.  Skin: Warm and dry without trophic changes noted.  Musculoskeletal: exam reveals no obvious joint deformities, tenderness or joint swelling or erythema.   Neurologically:  Mental status: The patient is awake, alert and oriented in all 4 spheres.Herimmediate and remote memory, attention,  language skills and fund of knowledge  are appropriate. There is no evidence of aphasia, agnosia, apraxia or anomia. Speech is clear with normal prosody and enunciation. Thought process is linear. Mood is normaland affect is normal.  Cranial nerves II - XII are as described above under HEENT exam. In addition: shoulder shrug is normal with equal shoulder height noted. Motor exam: Normal bulk, strength and tone is noted. There is no drift,or restingtremor. Reflexes are1+ throughout.She has a slight left more than right hand postural tremor, no significant action tremor, no intention tremor.  Fine motor skills and coordination: intact and stable.  Cerebellar testing: No dysmetria or intention tremor. There is no truncal or gait ataxia.  Sensory exam: intact to light touch.  Gait, station and balance:Shestands easily. No veering to one side is noted. No leaning to one side is noted. Posture is age-appropriate and stance is narrow based. Gait showsnormalstride length and normalpace. No problems turning are noted.  Assessmentand Plan:  In summary,Kelly Burns is a 74 year old right-handed woman with an underlying medical history of osteoarthritis, diverticulosis, hearing loss, and overweight state,who presents for follow-up consultation of her tremors, likely mild essential tremor, her father had a tremor, as she recalls today.  She has a stable head tremor and a very mild hand tremor, she has felt stable.  She does not feel there is need for symptomatic medication.  We mutually agreed to continue to monitor her symptoms and examination.  She has no signs of parkinsonism.  We talked about utilizing symptomatic Medication down the road, we have previously talked about potentially utilizing a beta-blocker or primidone if need be.  Since she is doing well and looks stable, we mutually agreed to pursue a 1 year checkup.  She has had some sleep difficulty.  She uses lorazepam very sparingly.  She  is encouraged to try melatonin.  She tried it a few years ago and as she recalls it helped a little bit. She is reminded to stay well-hydrated and well rested. We talked about common tremor triggers again today.  She is advised to call with any interim questions or concerns.  I answered all her questions today and she was in agreement.  I spent 30 minutes in total face-to-face time and in reviewing records during pre-charting, more than 50% of which was spent in counseling and coordination of care, reviewing test results, reviewing medications and treatment regimen and/or in discussing or reviewing the diagnosis of ET, the prognosis and treatment options. Pertinent laboratory and imaging test results that were available during this visit with the patient were reviewed by me and considered in my medical decision making (see chart for details).

## 2020-06-28 ENCOUNTER — Other Ambulatory Visit: Payer: Self-pay | Admitting: Internal Medicine

## 2020-06-28 DIAGNOSIS — Z1231 Encounter for screening mammogram for malignant neoplasm of breast: Secondary | ICD-10-CM

## 2020-07-12 DIAGNOSIS — N390 Urinary tract infection, site not specified: Secondary | ICD-10-CM | POA: Diagnosis not present

## 2020-07-31 DIAGNOSIS — R339 Retention of urine, unspecified: Secondary | ICD-10-CM | POA: Diagnosis not present

## 2020-07-31 DIAGNOSIS — N390 Urinary tract infection, site not specified: Secondary | ICD-10-CM | POA: Diagnosis not present

## 2020-07-31 DIAGNOSIS — R300 Dysuria: Secondary | ICD-10-CM | POA: Diagnosis not present

## 2020-08-13 ENCOUNTER — Ambulatory Visit
Admission: RE | Admit: 2020-08-13 | Discharge: 2020-08-13 | Disposition: A | Discharge status: Home / Self Care | Payer: Medicare Other | Source: Ambulatory Visit | Attending: Internal Medicine | Admitting: Internal Medicine

## 2020-08-13 ENCOUNTER — Other Ambulatory Visit: Payer: Self-pay

## 2020-08-13 DIAGNOSIS — Z1231 Encounter for screening mammogram for malignant neoplasm of breast: Secondary | ICD-10-CM | POA: Diagnosis not present

## 2020-09-04 DIAGNOSIS — Z20822 Contact with and (suspected) exposure to covid-19: Secondary | ICD-10-CM | POA: Diagnosis not present

## 2021-05-21 DIAGNOSIS — Z0000 Encounter for general adult medical examination without abnormal findings: Secondary | ICD-10-CM | POA: Diagnosis not present

## 2021-05-21 DIAGNOSIS — R5382 Chronic fatigue, unspecified: Secondary | ICD-10-CM | POA: Diagnosis not present

## 2021-05-28 DIAGNOSIS — E782 Mixed hyperlipidemia: Secondary | ICD-10-CM | POA: Diagnosis not present

## 2021-05-28 DIAGNOSIS — R5382 Chronic fatigue, unspecified: Secondary | ICD-10-CM | POA: Diagnosis not present

## 2021-05-28 DIAGNOSIS — Z0000 Encounter for general adult medical examination without abnormal findings: Secondary | ICD-10-CM | POA: Diagnosis not present

## 2021-05-28 DIAGNOSIS — Z23 Encounter for immunization: Secondary | ICD-10-CM | POA: Diagnosis not present

## 2021-05-28 DIAGNOSIS — K5730 Diverticulosis of large intestine without perforation or abscess without bleeding: Secondary | ICD-10-CM | POA: Diagnosis not present

## 2021-05-28 DIAGNOSIS — M159 Polyosteoarthritis, unspecified: Secondary | ICD-10-CM | POA: Diagnosis not present

## 2021-05-28 DIAGNOSIS — G720 Drug-induced myopathy: Secondary | ICD-10-CM | POA: Diagnosis not present

## 2021-06-14 DIAGNOSIS — N390 Urinary tract infection, site not specified: Secondary | ICD-10-CM | POA: Diagnosis not present

## 2021-07-03 ENCOUNTER — Ambulatory Visit: Payer: Medicare Other | Admitting: Neurology

## 2021-07-05 ENCOUNTER — Other Ambulatory Visit: Payer: Self-pay | Admitting: Internal Medicine

## 2021-07-05 DIAGNOSIS — Z1231 Encounter for screening mammogram for malignant neoplasm of breast: Secondary | ICD-10-CM

## 2021-07-22 DIAGNOSIS — D485 Neoplasm of uncertain behavior of skin: Secondary | ICD-10-CM | POA: Diagnosis not present

## 2021-07-22 DIAGNOSIS — L578 Other skin changes due to chronic exposure to nonionizing radiation: Secondary | ICD-10-CM | POA: Diagnosis not present

## 2021-07-22 DIAGNOSIS — L853 Xerosis cutis: Secondary | ICD-10-CM | POA: Diagnosis not present

## 2021-07-22 DIAGNOSIS — D1801 Hemangioma of skin and subcutaneous tissue: Secondary | ICD-10-CM | POA: Diagnosis not present

## 2021-07-22 DIAGNOSIS — L814 Other melanin hyperpigmentation: Secondary | ICD-10-CM | POA: Diagnosis not present

## 2021-07-22 DIAGNOSIS — D225 Melanocytic nevi of trunk: Secondary | ICD-10-CM | POA: Diagnosis not present

## 2021-07-22 DIAGNOSIS — L918 Other hypertrophic disorders of the skin: Secondary | ICD-10-CM | POA: Diagnosis not present

## 2021-07-22 DIAGNOSIS — L851 Acquired keratosis [keratoderma] palmaris et plantaris: Secondary | ICD-10-CM | POA: Diagnosis not present

## 2021-07-22 DIAGNOSIS — L821 Other seborrheic keratosis: Secondary | ICD-10-CM | POA: Diagnosis not present

## 2021-07-22 DIAGNOSIS — L219 Seborrheic dermatitis, unspecified: Secondary | ICD-10-CM | POA: Diagnosis not present

## 2021-07-22 DIAGNOSIS — L570 Actinic keratosis: Secondary | ICD-10-CM | POA: Diagnosis not present

## 2021-07-22 DIAGNOSIS — Z23 Encounter for immunization: Secondary | ICD-10-CM | POA: Diagnosis not present

## 2021-08-01 DIAGNOSIS — Z23 Encounter for immunization: Secondary | ICD-10-CM | POA: Diagnosis not present

## 2021-08-14 ENCOUNTER — Ambulatory Visit
Admission: RE | Admit: 2021-08-14 | Discharge: 2021-08-14 | Disposition: A | Discharge status: Home / Self Care | Payer: Medicare Other | Source: Ambulatory Visit | Attending: Internal Medicine | Admitting: Internal Medicine

## 2021-08-14 DIAGNOSIS — Z1231 Encounter for screening mammogram for malignant neoplasm of breast: Secondary | ICD-10-CM | POA: Diagnosis not present

## 2021-08-16 ENCOUNTER — Other Ambulatory Visit: Payer: Self-pay | Admitting: Internal Medicine

## 2021-08-16 DIAGNOSIS — R928 Other abnormal and inconclusive findings on diagnostic imaging of breast: Secondary | ICD-10-CM

## 2021-08-29 DIAGNOSIS — D485 Neoplasm of uncertain behavior of skin: Secondary | ICD-10-CM | POA: Diagnosis not present

## 2021-08-29 DIAGNOSIS — L905 Scar conditions and fibrosis of skin: Secondary | ICD-10-CM | POA: Diagnosis not present

## 2021-09-02 ENCOUNTER — Ambulatory Visit
Admission: RE | Admit: 2021-09-02 | Discharge: 2021-09-02 | Disposition: A | Discharge status: Home / Self Care | Payer: Medicare Other | Source: Ambulatory Visit | Attending: Internal Medicine | Admitting: Internal Medicine

## 2021-09-02 DIAGNOSIS — R928 Other abnormal and inconclusive findings on diagnostic imaging of breast: Secondary | ICD-10-CM

## 2021-09-02 DIAGNOSIS — R922 Inconclusive mammogram: Secondary | ICD-10-CM | POA: Diagnosis not present

## 2021-09-02 DIAGNOSIS — R921 Mammographic calcification found on diagnostic imaging of breast: Secondary | ICD-10-CM | POA: Diagnosis not present

## 2021-09-25 DIAGNOSIS — T8149XA Infection following a procedure, other surgical site, initial encounter: Secondary | ICD-10-CM | POA: Diagnosis not present

## 2021-11-01 DIAGNOSIS — N390 Urinary tract infection, site not specified: Secondary | ICD-10-CM | POA: Diagnosis not present

## 2021-12-26 DIAGNOSIS — M79643 Pain in unspecified hand: Secondary | ICD-10-CM | POA: Diagnosis not present

## 2022-01-06 DIAGNOSIS — N390 Urinary tract infection, site not specified: Secondary | ICD-10-CM | POA: Diagnosis not present

## 2022-02-24 DIAGNOSIS — L0231 Cutaneous abscess of buttock: Secondary | ICD-10-CM | POA: Diagnosis not present

## 2022-02-25 DIAGNOSIS — L539 Erythematous condition, unspecified: Secondary | ICD-10-CM | POA: Diagnosis not present

## 2022-04-28 DIAGNOSIS — R0989 Other specified symptoms and signs involving the circulatory and respiratory systems: Secondary | ICD-10-CM | POA: Diagnosis not present

## 2022-04-28 DIAGNOSIS — J0100 Acute maxillary sinusitis, unspecified: Secondary | ICD-10-CM | POA: Diagnosis not present

## 2022-05-22 DIAGNOSIS — N390 Urinary tract infection, site not specified: Secondary | ICD-10-CM | POA: Diagnosis not present

## 2022-06-04 DIAGNOSIS — E782 Mixed hyperlipidemia: Secondary | ICD-10-CM | POA: Diagnosis not present

## 2022-06-04 DIAGNOSIS — R5382 Chronic fatigue, unspecified: Secondary | ICD-10-CM | POA: Diagnosis not present

## 2022-06-04 DIAGNOSIS — Z0000 Encounter for general adult medical examination without abnormal findings: Secondary | ICD-10-CM | POA: Diagnosis not present

## 2022-06-11 DIAGNOSIS — R051 Acute cough: Secondary | ICD-10-CM | POA: Diagnosis not present

## 2022-06-11 DIAGNOSIS — J0100 Acute maxillary sinusitis, unspecified: Secondary | ICD-10-CM | POA: Diagnosis not present

## 2022-06-11 DIAGNOSIS — Z0000 Encounter for general adult medical examination without abnormal findings: Secondary | ICD-10-CM | POA: Diagnosis not present

## 2022-06-11 DIAGNOSIS — M810 Age-related osteoporosis without current pathological fracture: Secondary | ICD-10-CM | POA: Diagnosis not present

## 2022-06-11 DIAGNOSIS — M159 Polyosteoarthritis, unspecified: Secondary | ICD-10-CM | POA: Diagnosis not present

## 2022-06-11 DIAGNOSIS — R5382 Chronic fatigue, unspecified: Secondary | ICD-10-CM | POA: Diagnosis not present

## 2022-06-11 DIAGNOSIS — G720 Drug-induced myopathy: Secondary | ICD-10-CM | POA: Diagnosis not present

## 2022-06-11 DIAGNOSIS — E782 Mixed hyperlipidemia: Secondary | ICD-10-CM | POA: Diagnosis not present

## 2022-06-11 DIAGNOSIS — K5730 Diverticulosis of large intestine without perforation or abscess without bleeding: Secondary | ICD-10-CM | POA: Diagnosis not present

## 2022-06-29 ENCOUNTER — Emergency Department (HOSPITAL_COMMUNITY): Payer: Medicare Other

## 2022-06-29 ENCOUNTER — Encounter (HOSPITAL_COMMUNITY): Payer: Self-pay | Admitting: Emergency Medicine

## 2022-06-29 ENCOUNTER — Observation Stay (HOSPITAL_COMMUNITY)
Admission: EM | Admit: 2022-06-29 | Discharge: 2022-06-30 | Disposition: A | Discharge status: Home / Self Care | Payer: Medicare Other | Attending: Family Medicine | Admitting: Family Medicine

## 2022-06-29 DIAGNOSIS — R112 Nausea with vomiting, unspecified: Principal | ICD-10-CM | POA: Diagnosis present

## 2022-06-29 DIAGNOSIS — R6889 Other general symptoms and signs: Secondary | ICD-10-CM | POA: Diagnosis not present

## 2022-06-29 DIAGNOSIS — R200 Anesthesia of skin: Secondary | ICD-10-CM | POA: Diagnosis present

## 2022-06-29 DIAGNOSIS — Z79899 Other long term (current) drug therapy: Secondary | ICD-10-CM | POA: Diagnosis not present

## 2022-06-29 DIAGNOSIS — E860 Dehydration: Secondary | ICD-10-CM | POA: Diagnosis not present

## 2022-06-29 DIAGNOSIS — R0602 Shortness of breath: Secondary | ICD-10-CM | POA: Insufficient documentation

## 2022-06-29 DIAGNOSIS — Z87891 Personal history of nicotine dependence: Secondary | ICD-10-CM | POA: Diagnosis not present

## 2022-06-29 DIAGNOSIS — I495 Sick sinus syndrome: Secondary | ICD-10-CM | POA: Insufficient documentation

## 2022-06-29 DIAGNOSIS — J189 Pneumonia, unspecified organism: Secondary | ICD-10-CM | POA: Diagnosis present

## 2022-06-29 DIAGNOSIS — J9811 Atelectasis: Secondary | ICD-10-CM | POA: Diagnosis not present

## 2022-06-29 DIAGNOSIS — I4891 Unspecified atrial fibrillation: Secondary | ICD-10-CM

## 2022-06-29 DIAGNOSIS — R0600 Dyspnea, unspecified: Secondary | ICD-10-CM | POA: Diagnosis not present

## 2022-06-29 DIAGNOSIS — Z1152 Encounter for screening for COVID-19: Secondary | ICD-10-CM | POA: Insufficient documentation

## 2022-06-29 DIAGNOSIS — I639 Cerebral infarction, unspecified: Secondary | ICD-10-CM | POA: Insufficient documentation

## 2022-06-29 DIAGNOSIS — R531 Weakness: Secondary | ICD-10-CM | POA: Diagnosis not present

## 2022-06-29 DIAGNOSIS — I455 Other specified heart block: Secondary | ICD-10-CM | POA: Diagnosis present

## 2022-06-29 DIAGNOSIS — R110 Nausea: Secondary | ICD-10-CM | POA: Diagnosis not present

## 2022-06-29 DIAGNOSIS — I480 Paroxysmal atrial fibrillation: Secondary | ICD-10-CM | POA: Diagnosis present

## 2022-06-29 DIAGNOSIS — I499 Cardiac arrhythmia, unspecified: Secondary | ICD-10-CM | POA: Diagnosis not present

## 2022-06-29 DIAGNOSIS — Z743 Need for continuous supervision: Secondary | ICD-10-CM | POA: Diagnosis not present

## 2022-06-29 DIAGNOSIS — R197 Diarrhea, unspecified: Secondary | ICD-10-CM

## 2022-06-29 DIAGNOSIS — R404 Transient alteration of awareness: Secondary | ICD-10-CM | POA: Diagnosis not present

## 2022-06-29 HISTORY — DX: Pneumonia, unspecified organism: J18.9

## 2022-06-29 LAB — RESP PANEL BY RT-PCR (FLU A&B, COVID) ARPGX2
Influenza A by PCR: NEGATIVE
Influenza B by PCR: NEGATIVE
SARS Coronavirus 2 by RT PCR: NEGATIVE

## 2022-06-29 LAB — CBC WITH DIFFERENTIAL/PLATELET
Abs Immature Granulocytes: 0.08 10*3/uL — ABNORMAL HIGH (ref 0.00–0.07)
Basophils Absolute: 0.1 10*3/uL (ref 0.0–0.1)
Basophils Relative: 1 %
Eosinophils Absolute: 0 10*3/uL (ref 0.0–0.5)
Eosinophils Relative: 0 %
HCT: 50.4 % — ABNORMAL HIGH (ref 36.0–46.0)
Hemoglobin: 17 g/dL — ABNORMAL HIGH (ref 12.0–15.0)
Immature Granulocytes: 0 %
Lymphocytes Relative: 8 %
Lymphs Abs: 1.4 10*3/uL (ref 0.7–4.0)
MCH: 32.2 pg (ref 26.0–34.0)
MCHC: 33.7 g/dL (ref 30.0–36.0)
MCV: 95.5 fL (ref 80.0–100.0)
Monocytes Absolute: 1.1 10*3/uL — ABNORMAL HIGH (ref 0.1–1.0)
Monocytes Relative: 6 %
Neutro Abs: 16.3 10*3/uL — ABNORMAL HIGH (ref 1.7–7.7)
Neutrophils Relative %: 85 %
Platelets: 365 10*3/uL (ref 150–400)
RBC: 5.28 MIL/uL — ABNORMAL HIGH (ref 3.87–5.11)
RDW: 12.4 % (ref 11.5–15.5)
WBC: 19 10*3/uL — ABNORMAL HIGH (ref 4.0–10.5)
nRBC: 0 % (ref 0.0–0.2)

## 2022-06-29 LAB — COMPREHENSIVE METABOLIC PANEL
ALT: 25 U/L (ref 0–44)
AST: 22 U/L (ref 15–41)
Albumin: 4.2 g/dL (ref 3.5–5.0)
Alkaline Phosphatase: 75 U/L (ref 38–126)
Anion gap: 7 (ref 5–15)
BUN: 16 mg/dL (ref 8–23)
CO2: 24 mmol/L (ref 22–32)
Calcium: 9.9 mg/dL (ref 8.9–10.3)
Chloride: 108 mmol/L (ref 98–111)
Creatinine, Ser: 0.92 mg/dL (ref 0.44–1.00)
GFR, Estimated: >60 mL/min (ref >60)
Glucose, Bld: 118 mg/dL — ABNORMAL HIGH (ref 70–99)
Potassium: 4.3 mmol/L (ref 3.5–5.1)
Sodium: 139 mmol/L (ref 135–145)
Total Bilirubin: 0.7 mg/dL (ref 0.3–1.2)
Total Protein: 6.5 g/dL (ref 6.5–8.1)

## 2022-06-29 LAB — LACTIC ACID, PLASMA: Lactic Acid, Venous: 1.4 mmol/L (ref 0.5–1.9)

## 2022-06-29 LAB — TSH: TSH: 0.804 u[IU]/mL (ref 0.350–4.500)

## 2022-06-29 LAB — TROPONIN I (HIGH SENSITIVITY)
Troponin I (High Sensitivity): 7 ng/L (ref <18)
Troponin I (High Sensitivity): 5 ng/L (ref <18)

## 2022-06-29 MED ORDER — SODIUM CHLORIDE 0.9 % IV SOLN: 2.0000 g | INTRAVENOUS | Status: DC

## 2022-06-29 MED ORDER — SODIUM CHLORIDE 0.9 % IV SOLN
1.0000 g | Freq: Once | INTRAVENOUS | Status: AC
  Administered 2022-06-29: 1 g via INTRAVENOUS
  Filled 2022-06-29: qty 10

## 2022-06-29 MED ORDER — IOHEXOL 350 MG/ML SOLN
75.0000 mL | Freq: Once | INTRAVENOUS | Status: AC | PRN
  Administered 2022-06-29: 75 mL via INTRAVENOUS

## 2022-06-29 MED ORDER — LACTATED RINGERS IV SOLN: INTRAVENOUS | Status: DC

## 2022-06-29 MED ORDER — SODIUM CHLORIDE 0.9 % IV SOLN
12.5000 mg | Freq: Four times a day (QID) | INTRAVENOUS | Status: DC | PRN
  Administered 2022-06-29: 12.5 mg via INTRAVENOUS
  Filled 2022-06-29: qty 0.5

## 2022-06-29 MED ORDER — SODIUM CHLORIDE 0.9 % IV SOLN
500.0000 mg | Freq: Once | INTRAVENOUS | Status: AC
  Administered 2022-06-29: 500 mg via INTRAVENOUS
  Filled 2022-06-29: qty 5

## 2022-06-29 MED ORDER — SODIUM CHLORIDE 0.9 % IV BOLUS
1000.0000 mL | Freq: Once | INTRAVENOUS | Status: AC
  Administered 2022-06-29: 1000 mL via INTRAVENOUS

## 2022-06-29 MED ORDER — ONDANSETRON HCL 4 MG/2ML IJ SOLN
4.0000 mg | Freq: Once | INTRAMUSCULAR | Status: AC
  Administered 2022-06-29: 4 mg via INTRAVENOUS
  Filled 2022-06-29: qty 2

## 2022-06-29 MED ORDER — ENOXAPARIN SODIUM 40 MG/0.4ML IJ SOSY
40.0000 mg | PREFILLED_SYRINGE | INTRAMUSCULAR | Status: DC
  Administered 2022-06-30: 40 mg via SUBCUTANEOUS
  Filled 2022-06-29: qty 0.4

## 2022-06-29 MED ORDER — SODIUM CHLORIDE 0.9 % IV SOLN: 500.0000 mg | INTRAVENOUS | Status: DC

## 2022-06-29 NOTE — Assessment & Plan Note (Addendum)
1. GI pathogen pnl pending 2. C.Diff pending given recent keflex use for PNA 3. Got IVF bolus in ED, will keep on IVF 4. Zofran didn't work for nausea, trying phenergan. 5. Intermittent abd pain, mostly cramping when she needs to have BM.

## 2022-06-29 NOTE — ED Provider Notes (Addendum)
[facility] EMERGENCY DEPARTMENT Provider Note   CSN: [id number] Arrival date & time: 06/29/22  1447     History  Chief Complaint  Patient presents with   Weakness    Kelly Burns is a 76 y.o. female.  Pt is a 76 yo female with a pmhx significant for diverticulitis, deafness to right ear, and HLD.  Pt said she's had a cough for 3 weeks.  She saw her doctor who put her on keflex.  She has taken that and it has helped a little.  She still feels sob.  She has dizziness.  No palpitations.  She has had some n/v/d.  Pt has not had fevers.         Home Medications Prior to Admission medications   Medication Sig Start Date End Date Taking? Authorizing Provider  b complex vitamins tablet Take 1 tablet by mouth daily.    [provider]  CALCIUM PO Take by mouth.    [provider]  estradiol (ESTRACE) 0.1 MG/GM vaginal cream Place 1 Applicatorful vaginally at bedtime.    [provider]  LORazepam (ATIVAN) 1 MG tablet Take 1 mg by mouth at bedtime as needed for sleep.  10/07/18   [provider]  Multiple Vitamin (MULTIVITAMIN) tablet Take 1 tablet by mouth daily.    [provider]  Probiotic Product (ALIGN PO) Take by mouth.    [provider]  VITAMIN D PO Take 3,000 mg by mouth.     [provider]      Allergies    Other    Review of Systems   Review of Systems  Respiratory:  Positive for cough and shortness of breath.   Neurological:  Positive for dizziness.  All other systems reviewed and are negative.   Physical Exam Updated Vital Signs BP 120/82   Pulse 88   Temp 98.1 F (36.7 C) (Oral)   Resp 14   SpO2 94%  Physical Exam Vitals and nursing note reviewed.  Constitutional:      Appearance: Normal appearance.  HENT:     Head: Normocephalic and atraumatic.     Right Ear: External ear normal.     Left Ear: External ear normal.     Nose: Nose normal.     Mouth/Throat:      Mouth: Mucous membranes are dry.  Eyes:     Extraocular Movements: Extraocular movements intact.     Conjunctiva/sclera: Conjunctivae normal.     Pupils: Pupils are equal, round, and reactive to light.  Cardiovascular:     Rate and Rhythm: Normal rate and regular rhythm.     Pulses: Normal pulses.     Heart sounds: Normal heart sounds.  Pulmonary:     Breath sounds: Rhonchi present.  Abdominal:     General: Abdomen is flat. Bowel sounds are normal.     Palpations: Abdomen is soft.  Musculoskeletal:        General: Normal range of motion.     Cervical back: Normal range of motion and neck supple.  Skin:    General: Skin is warm.     Capillary Refill: Capillary refill takes less than 2 seconds.  Neurological:     General: No focal deficit present.     Mental Status: She is alert and oriented to person, place, and time.  Psychiatric:        Mood and Affect: Mood normal.        Behavior: Behavior normal.  ED Results / Procedures / Treatments   Labs (all labs ordered are listed, but only abnormal results are displayed) Labs Reviewed  CBC WITH DIFFERENTIAL/PLATELET - Abnormal; Notable for the following components:      Result Value   WBC 19.0 (*)    RBC 5.28 (*)    Hemoglobin 17.0 (*)    HCT 50.4 (*)    Neutro Abs 16.3 (*)    Monocytes Absolute 1.1 (*)    Abs Immature Granulocytes 0.08 (*)    All other components within normal limits  COMPREHENSIVE METABOLIC PANEL - Abnormal; Notable for the following components:   Glucose, Bld 118 (*)    All other components within normal limits  RESP PANEL BY RT-PCR (FLU A&B, COVID) ARPGX2  CULTURE, BLOOD (ROUTINE X 2)  CULTURE, BLOOD (ROUTINE X 2)  GASTROINTESTINAL PANEL BY PCR, STOOL (REPLACES STOOL CULTURE)  C DIFFICILE QUICK SCREEN W PCR REFLEX    TSH  LACTIC ACID, PLASMA  LACTIC ACID, PLASMA  TROPONIN I (HIGH SENSITIVITY)  TROPONIN I (HIGH SENSITIVITY)    EKG EKG Interpretation  Date/Time:  Sunday June 29 2022  17:22:39 EDT Ventricular Rate:  98 PR Interval:  134 QRS Duration: 91 QT Interval:  331 QTC Calculation: 423 R Axis:   46 Text Interpretation: Sinus rhythm Low voltage, precordial leads Borderline repolarization abnormality now in sinus Confirmed by Isla Pence [phone]) on 06/29/2022 5:30:50 PM  Radiology CT Angio Chest PE W and/or Wo Contrast  Result Date: 06/29/2022 CLINICAL DATA:  Weakness.  Atrial fibrillation. EXAM: CT ANGIOGRAPHY CHEST WITH CONTRAST TECHNIQUE: Multidetector CT imaging of the chest was performed using the standard protocol during bolus administration of intravenous contrast. Multiplanar CT image reconstructions and MIPs were obtained to evaluate the vascular anatomy. RADIATION DOSE REDUCTION: This exam was performed according to the departmental dose-optimization program which includes automated exposure control, adjustment of the mA and/or kV according to patient size and/or use of iterative reconstruction technique. CONTRAST:  40m OMNIPAQUE IOHEXOL 350 MG/ML SOLN COMPARISON:  Chest x-ray same day FINDINGS: Cardiovascular: Satisfactory opacification of the pulmonary arteries to the segmental level. No evidence of pulmonary embolism. Normal heart size. No pericardial effusion. Mediastinum/Nodes: No enlarged mediastinal, hilar, or axillary lymph nodes. Thyroid gland, trachea, and esophagus demonstrate no significant findings. Lungs/Pleura: There is minimal patchy atelectasis in the right lung base. The lungs are otherwise clear. No pleural effusion or pneumothorax. Upper Abdomen: Cholecystectomy clips are present. Musculoskeletal: No chest wall abnormality. No acute or significant osseous findings. Review of the MIP images confirms the above findings. IMPRESSION: 1. No evidence for pulmonary embolism. 2. Minimal patchy atelectasis in the right lung base. Electronically Signed   By: ARonney AstersM.D.   On: 06/29/2022 19:37   DG Chest 2 View  Result Date: 06/29/2022 CLINICAL  DATA:  Dyspnea EXAM: CHEST - 2 VIEW COMPARISON:  None Available. FINDINGS: The heart size and mediastinal contours are within normal limits. Patchy airspace opacity in the periphery of the right lower lobe. Left lung is clear. No pleural effusion or pneumothorax. The visualized skeletal structures are unremarkable. IMPRESSION: Patchy airspace opacity in the periphery of the right lower lobe, suspicious for pneumonia. Follow-up to resolution is recommended. Electronically Signed   By: NDavina PokeD.O.   On: 06/29/2022 15:55    Procedures Procedures    Medications Ordered in ED Medications  sodium chloride 0.9 % bolus 1,000 mL (0 mLs Intravenous Stopped 06/29/22 2121)  cefTRIAXone (ROCEPHIN) 1 g in sodium chloride 0.9 % 100  mL IVPB (1 g Intravenous New Bag/Given 06/29/22 1948)  azithromycin (ZITHROMAX) 500 mg in sodium chloride 0.9 % 250 mL IVPB (500 mg Intravenous New Bag/Given 06/29/22 2029)  iohexol (OMNIPAQUE) 350 MG/ML injection 75 mL (75 mLs Intravenous Contrast Given 06/29/22 1922)  ondansetron (ZOFRAN) injection 4 mg (4 mg Intravenous Given 06/29/22 2148)    ED Course/ Medical Decision Making/ A&P                           Medical Decision Making Amount and/or Complexity of Data Reviewed Labs: ordered. Radiology: ordered.  Risk Prescription drug management. Decision regarding hospitalization.   This patient presents to the ED for concern of cough/sob, this involves an extensive number of treatment options, and is a complaint that carries with it a high risk of complications and morbidity.  The differential diagnosis includes pna, covid, bronchitis   Co morbidities that complicate the patient evaluation  diverticulitis, deafness to right ear, and HLD   Additional history obtained:  Additional history obtained from epic chart review External records from outside source obtained and reviewed including daughter   Lab Tests:  I Ordered, and personally interpreted  labs.  The pertinent results include:  cbc with wbc elevated at 19.0; cmp nl   Imaging Studies ordered:  I ordered imaging studies including cxr and ct chest I independently visualized and interpreted imaging which showed  CXR:  IMPRESSION:  Patchy airspace opacity in the periphery of the right lower lobe,  suspicious for pneumonia. Follow-up to resolution is recommended.  CT chest: IMPRESSION:  1. No evidence for pulmonary embolism.  2. Minimal patchy atelectasis in the right lung base.   I agree with the radiologist interpretation   Cardiac Monitoring:  The patient was maintained on a cardiac monitor.  I personally viewed and interpreted the cardiac monitored which showed an underlying rhythm of: nsr.  However, pt was in afib when she arrived.  No hx afib.   Medicines ordered and prescription drug management:  I ordered medication including rocephin/zithromax  for pna  Reevaluation of the patient after these medicines showed that the patient improved I have reviewed the patients home medicines and have made adjustments as needed   Test Considered:  ct   Critical Interventions:  ivfs   Consultations Obtained:  I requested consultation with the hospitalist (Dr. Alcario Drought),  and discussed lab and imaging findings as well as pertinent plan - he will eval pt for admission   Problem List / ED Course:  A.fib:  pt was in afib when she arrived.  EKG obtained.  She is now in NSR after spontaneously converting.  She has not gone back into afib, so I have not started blood thinners. ? PNA:  cough/sob:  CXR with possible pna, so pt given rocephin/zithromax.  CT shows atelectasis N/V/D:  pt has developed worsening n/v/d since she has been here.  Stool studies have been ordered as she's been on abx.   Reevaluation:  After the interventions noted above, I reevaluated the patient and found that they have :stayed the same   Social Determinants of Health:  Lives at  home   Dispostion:  After consideration of the diagnostic results and the patients response to treatment, I feel that the patent would benefit from admission.          Final Clinical Impression(s) / ED Diagnoses Final diagnoses:  Weakness  Dehydration  Nausea vomiting and diarrhea  Atrial fibrillation with RVR ([place])  Rx / DC Orders ED Discharge Orders     None         Isla Pence, MD 06/29/22 2204    Isla Pence, MD 06/29/22 2220

## 2022-06-29 NOTE — Assessment & Plan Note (Addendum)
Pt having ongoing sinus pauses on EKG now that no longer in A.Fib. Nausea associated with pauses.  ? Vasovagal? Regardless, hopefully she doesn't go back into a.fib as this is going to make use of nodal blocking agents difficult. If persistent despite treatment of nausea, will need to get cardiology involved.

## 2022-06-29 NOTE — ED Notes (Signed)
Pt ambulated to restroom to have a BM and she became very nauseous and vomited while on the commode. She had multiple episodes of emesis and diarrhea. This RN informed Dr. Gilford Raid who stated she would be putting the patient up for admission.The patient's IV had infiltrated moments prior to going to the bathroom so there was no IV access when the patient was in the restroom. Once the patient was able to stop vomiting this wheeled her back into her room, connected her back to cardiac monitor, BP and pulse ox and obtained another IV where I administered the ordered Zofran.

## 2022-06-29 NOTE — ED Notes (Signed)
Pt assisted to the bedside commode because the patient thought she would have to have another episode of diarrhea. She did not have a BM but she did have another episode of large emesis. Dr. Hurley Cisco informed via epic secure chat. See MAR for orders. Pt and daughter updated on plan of care.

## 2022-06-29 NOTE — ED Notes (Signed)
This RN has called pharmacy again to please send the phenergan.

## 2022-06-29 NOTE — ED Notes (Signed)
Patient transported to CT 

## 2022-06-29 NOTE — ED Notes (Addendum)
Upon speaking with the patient's daughter, she reports the patient had one episode of tingling to her left arm yesterday but it went away after about one hour. Dr. Hurley Cisco at bedside and verbalized plan to obtain a MRI.

## 2022-06-29 NOTE — Assessment & Plan Note (Addendum)
New Dx - in setting of acute illness though CHADS Vasc is 2, will become a 3 in Dec. 1. Will hold off on starting [facility] for the moment and defer this to cards in follow up. 2. Currently back in NSR but with sinus pauses associated with nausea (see below) 1. Dont want to start BB / CCB as a result. 3. Tele monitor 4. 2d echo 5. Follow up cards after admit.

## 2022-06-29 NOTE — Assessment & Plan Note (Signed)
RLL PNA vs atelectasis.  Has had cough for 3 weeks, does have WBC 19k. PNA if present is mild at present time with no O2 requirement. 1. PNA pathway 2. Rocephin + azithro 3. S.pneumo urinary antigen 4. COVID and flu neg

## 2022-06-29 NOTE — ED Notes (Signed)
Still waiting on phenergan. This RN has called pharmacy and was told they are mixing it. Pt and family updated.

## 2022-06-29 NOTE — ED Triage Notes (Signed)
Patient BIB [facility] for generalized weakness that started a few days ago, EMS reports new onset afib rate 80-100. Patient is alert, oriented, and in no apparent distress at this time.

## 2022-06-29 NOTE — H&P (Shared)
History and Physical    Patient: Kelly Burns:[id number] DOB: 08/21/1946 DOA: 06/29/2022 DOS: the patient was seen and examined on 06/29/2022 PCP: Merrilee Seashore, MD  Patient coming from: {Point_of_Origin:26777}  Chief Complaint:  Chief Complaint  Patient presents with   Weakness   HPI: Kelly Burns is a 76 y.o. female with medical history significant of ***  Review of Systems: {ROS_Text:26778} Past Medical History:  Diagnosis Date   Deafness in right ear    Diverticulitis    Hyperlipidemia    Past Surgical History:  Procedure Laterality Date   APPENDECTOMY     BREAST BIOPSY Right    CHOLECYSTECTOMY     MIDDLE EAR SURGERY     TONSILLECTOMY     Social History:  reports that she has quit smoking. Her smoking use included cigars. She has never used smokeless tobacco. She reports current alcohol use. She reports that she does not use drugs.  Allergies  Allergen Reactions   Other Itching and Rash    CSF-HC powder applied to the mastoid cavity results in severe itching and ear drainage.    Family History  Problem Relation Age of Onset   Stroke Mother    Heart disease Mother    Dementia Father    Cancer Maternal Aunt        colon   Cancer Maternal Grandmother        breast   Breast cancer Maternal Grandmother    Breast cancer Cousin     Prior to Admission medications   Medication Sig Start Date End Date Taking? Authorizing Provider  b complex vitamins tablet Take 1 tablet by mouth daily.    [provider]  CALCIUM PO Take by mouth.    [provider]  estradiol (ESTRACE) 0.1 MG/GM vaginal cream Place 1 Applicatorful vaginally at bedtime.    [provider]  LORazepam (ATIVAN) 1 MG tablet Take 1 mg by mouth at bedtime as needed for sleep.  10/07/18   [provider]  Multiple Vitamin (MULTIVITAMIN) tablet Take 1 tablet by mouth daily.    [provider]  Probiotic Product (ALIGN PO) Take by mouth.     [provider]  VITAMIN D PO Take 3,000 mg by mouth.     [provider]    Physical Exam: Vitals:   06/29/22 1510 06/29/22 1511 06/29/22 1715 06/29/22 1900  BP: 134/81  123/74 120/82  Pulse: (!) 140  (!) 103 88  Resp: '18  17 14  '$ Temp: 98.1 F (36.7 C)     TempSrc: Oral     SpO2:  100% 96% 94%   *** Data Reviewed: {Tip this will not be part of the note when signed- Document your independent interpretation of telemetry tracing, EKG, lab, Radiology test or any other diagnostic tests. Add any new diagnostic test ordered today. (Optional):26781} {Results:26384}  Assessment and Plan: * Nausea vomiting and diarrhea GI pathogen pnl pending C.Diff pending given recent keflex use for PNA Got IVF bolus in ED, will keep on IVF  Community acquired pneumonia of right lower lobe of lung RLL PNA vs atelectasis.  Has had cough for 3 weeks, does have WBC 19k. PNA if present is mild at present time with no O2 requirement. PNA pathway Rocephin + azithro S.pneumo urinary antigen COVID and flu neg  Sinus pause Pt having ongoing sinus pauses on EKG now that no longer in A.Fib. Nausea associated with pauses.  ? Vasovagal? Regardless, hopefully she doesn't go back into  a.fib as this is going to make use of nodal blocking agents difficult.  PAF (paroxysmal atrial fibrillation) ([place]) New Dx - in setting of acute illness though CHADS Vasc is 2, will become a 3 in Dec. Will hold off on starting [facility] for the moment and defer this to cards in follow up. Currently back in NSR but with sinus pauses associated with nausea (see below) Dont want to start BB / CCB as a result. Tele monitor 2d echo Follow up cards after admit.      Advance Care Planning:   Code Status: Full Code ***  Consults: ***  Family Communication: ***  Severity of Illness: {Observation/Inpatient:21159}  Author: Etta Quill., DO 06/29/2022 11:39 PM  For on call review [email or web address].

## 2022-06-29 NOTE — ED Notes (Signed)
Dr. Gardener at bedside. 

## 2022-06-29 NOTE — ED Notes (Signed)
Pt complaining of IV hurting her. Peripheral IV noted to be infiltrated. Threasa Beards, RN aware.

## 2022-06-29 NOTE — ED Provider Triage Note (Signed)
Emergency Medicine Provider Triage Evaluation Note  Kelly Burns , a 76 y.o. female  was evaluated in triage.  Pt complains of not feeling well for the past few days.  Particularly since yesterday.  Denies chest pain.  Endorse shortness of breath.  States she has been having cough for the past 3 weeks.  Denies any palpitations.  Review of Systems  Positive: As above Negative: As above  Physical Exam  BP 134/81 (BP Location: Right Arm)   Pulse (!) 140   Temp 98.1 F (36.7 C) (Oral)   Resp 18   SpO2 100%  Gen:   Awake, no distress   Resp:  Normal effort  MSK:   Moves extremities without difficulty  Other:  Irregularly irregular rhythm  Medical Decision Making  Medically screening exam initiated at 3:11 PM.  Appropriate orders placed.  Kelly Burns was informed that the remainder of the evaluation will be completed by another provider, this initial triage assessment does not replace that evaluation, and the importance of remaining in the ED until their evaluation is complete.  EKG shows A-fib with RVR.  She is not on anticoagulation.  No clear indication of how long she may have been in this rhythm.  Denies prior history of A-fib.   Evlyn Courier, PA-C 06/29/22 1513

## 2022-06-30 ENCOUNTER — Observation Stay (HOSPITAL_BASED_OUTPATIENT_CLINIC_OR_DEPARTMENT_OTHER): Payer: Medicare Other

## 2022-06-30 ENCOUNTER — Inpatient Hospital Stay (HOSPITAL_BASED_OUTPATIENT_CLINIC_OR_DEPARTMENT_OTHER)
Admit: 2022-06-30 | Discharge: 2022-06-30 | Disposition: A | Discharge status: Home / Self Care | Payer: Medicare Other | Attending: Cardiology | Admitting: Cardiology

## 2022-06-30 ENCOUNTER — Observation Stay (HOSPITAL_COMMUNITY): Payer: Medicare Other

## 2022-06-30 ENCOUNTER — Other Ambulatory Visit: Payer: Self-pay | Admitting: Cardiology

## 2022-06-30 ENCOUNTER — Other Ambulatory Visit: Payer: Self-pay | Admitting: Neurology

## 2022-06-30 DIAGNOSIS — G459 Transient cerebral ischemic attack, unspecified: Secondary | ICD-10-CM

## 2022-06-30 DIAGNOSIS — I4891 Unspecified atrial fibrillation: Secondary | ICD-10-CM | POA: Diagnosis not present

## 2022-06-30 DIAGNOSIS — Z9621 Cochlear implant status: Secondary | ICD-10-CM | POA: Diagnosis not present

## 2022-06-30 DIAGNOSIS — R112 Nausea with vomiting, unspecified: Secondary | ICD-10-CM | POA: Diagnosis not present

## 2022-06-30 DIAGNOSIS — I480 Paroxysmal atrial fibrillation: Secondary | ICD-10-CM

## 2022-06-30 DIAGNOSIS — R200 Anesthesia of skin: Secondary | ICD-10-CM | POA: Diagnosis present

## 2022-06-30 DIAGNOSIS — I455 Other specified heart block: Secondary | ICD-10-CM | POA: Diagnosis not present

## 2022-06-30 DIAGNOSIS — R202 Paresthesia of skin: Secondary | ICD-10-CM | POA: Diagnosis not present

## 2022-06-30 DIAGNOSIS — H9192 Unspecified hearing loss, left ear: Secondary | ICD-10-CM | POA: Diagnosis not present

## 2022-06-30 DIAGNOSIS — J189 Pneumonia, unspecified organism: Secondary | ICD-10-CM | POA: Diagnosis not present

## 2022-06-30 DIAGNOSIS — R197 Diarrhea, unspecified: Secondary | ICD-10-CM | POA: Diagnosis not present

## 2022-06-30 DIAGNOSIS — Z8673 Personal history of transient ischemic attack (TIA), and cerebral infarction without residual deficits: Secondary | ICD-10-CM | POA: Diagnosis not present

## 2022-06-30 LAB — ECHOCARDIOGRAM COMPLETE
Area-P 1/2: 3.03 cm2
Calc EF: 73 %
S' Lateral: 3.2 cm
Single Plane A2C EF: 73.7 %
Single Plane A4C EF: 71.8 %

## 2022-06-30 LAB — BASIC METABOLIC PANEL
Anion gap: 7 (ref 5–15)
BUN: 12 mg/dL (ref 8–23)
CO2: 24 mmol/L (ref 22–32)
Calcium: 9.1 mg/dL (ref 8.9–10.3)
Chloride: 108 mmol/L (ref 98–111)
Creatinine, Ser: 0.78 mg/dL (ref 0.44–1.00)
GFR, Estimated: >60 mL/min (ref >60)
Glucose, Bld: 92 mg/dL (ref 70–99)
Potassium: 3.8 mmol/L (ref 3.5–5.1)
Sodium: 139 mmol/L (ref 135–145)

## 2022-06-30 LAB — LIPID PANEL
Cholesterol: 209 mg/dL — ABNORMAL HIGH (ref 0–200)
HDL: 36 mg/dL — ABNORMAL LOW (ref >40)
LDL Cholesterol: 141 mg/dL — ABNORMAL HIGH (ref 0–99)
Total CHOL/HDL Ratio: 5.8 RATIO
Triglycerides: 160 mg/dL — ABNORMAL HIGH (ref <150)
VLDL: 32 mg/dL (ref 0–40)

## 2022-06-30 LAB — CBC
HCT: 41.2 % (ref 36.0–46.0)
Hemoglobin: 13.4 g/dL (ref 12.0–15.0)
MCH: 31.8 pg (ref 26.0–34.0)
MCHC: 32.5 g/dL (ref 30.0–36.0)
MCV: 97.9 fL (ref 80.0–100.0)
Platelets: 263 10*3/uL (ref 150–400)
RBC: 4.21 MIL/uL (ref 3.87–5.11)
RDW: 12.7 % (ref 11.5–15.5)
WBC: 9.9 10*3/uL (ref 4.0–10.5)
nRBC: 0 % (ref 0.0–0.2)

## 2022-06-30 LAB — HEMOGLOBIN A1C
Hgb A1c MFr Bld: 5.8 % — ABNORMAL HIGH (ref 4.8–5.6)
Mean Plasma Glucose: 119.76 mg/dL

## 2022-06-30 LAB — PROCALCITONIN: Procalcitonin: <0.1 ng/mL

## 2022-06-30 LAB — STREP PNEUMONIAE URINARY ANTIGEN: Strep Pneumo Urinary Antigen: NEGATIVE

## 2022-06-30 MED ORDER — GADOBUTROL 1 MMOL/ML IV SOLN
9.0000 mL | Freq: Once | INTRAVENOUS | Status: AC | PRN
  Administered 2022-06-30: 9 mL via INTRAVENOUS

## 2022-06-30 MED ORDER — LORAZEPAM 2 MG/ML IJ SOLN
1.0000 mg | Freq: Once | INTRAMUSCULAR | Status: AC | PRN
  Administered 2022-06-30: 1 mg via INTRAVENOUS
  Filled 2022-06-30: qty 1

## 2022-06-30 MED ORDER — ASPIRIN 81 MG PO TBEC
81.0000 mg | DELAYED_RELEASE_TABLET | Freq: Every day | ORAL | Status: DC
  Administered 2022-06-30: 81 mg via ORAL
  Filled 2022-06-30: qty 1

## 2022-06-30 MED ORDER — APIXABAN 5 MG PO TABS: 5.0000 mg | ORAL_TABLET | Freq: Two times a day (BID) | ORAL | 1 refills | Status: DC

## 2022-06-30 MED ORDER — ONDANSETRON HCL 4 MG PO TABS: 4.0000 mg | ORAL_TABLET | Freq: Every day | ORAL | 0 refills | Status: DC | PRN

## 2022-06-30 MED ORDER — ROSUVASTATIN CALCIUM 20 MG PO TABS
20.0000 mg | ORAL_TABLET | Freq: Every day | ORAL | Status: DC
  Administered 2022-06-30: 20 mg via ORAL
  Filled 2022-06-30: qty 1

## 2022-06-30 MED ORDER — ROSUVASTATIN CALCIUM 5 MG PO TABS: 2.5000 mg | ORAL_TABLET | Freq: Every day | ORAL | 0 refills | Status: DC

## 2022-06-30 MED ORDER — APIXABAN 5 MG PO TABS
5.0000 mg | ORAL_TABLET | Freq: Two times a day (BID) | ORAL | Status: DC
  Administered 2022-06-30: 5 mg via ORAL
  Filled 2022-06-30: qty 1

## 2022-06-30 MED ORDER — PERFLUTREN LIPID MICROSPHERE
1.0000 mL | INTRAVENOUS | Status: AC | PRN
  Administered 2022-06-30: 2 mL via INTRAVENOUS
  Filled 2022-06-30: qty 10

## 2022-06-30 NOTE — Progress Notes (Signed)
  Echocardiogram 2D Echocardiogram has been performed.  Kelly Burns 06/30/2022, 10:54 AM

## 2022-06-30 NOTE — Progress Notes (Signed)
ZIO AT applied at hospital Dr. Levada Dy to read.

## 2022-06-30 NOTE — ED Notes (Signed)
2 attempts to obtain labs were unsuccessful. Phlebotomy notified.

## 2022-06-30 NOTE — Progress Notes (Addendum)
STROKE TEAM PROGRESS NOTE   INTERVAL HISTORY Seen in ED, pt lethargic but AAO x 3. New diagnosed afib, MRI neg, finally agreed with [facility] after long discussion.   Vitals:   06/30/22 0000 06/30/22 0030 06/30/22 0130 06/30/22 0330  BP: 119/77 (!) 114/57 114/62 107/79  Pulse: 91 87 79 89  Resp: '20 15 15 18  '$ Temp:    (!) 97.4 F (36.3 C)  TempSrc:    Oral  SpO2: 91% 91% 95% 94%   CBC:  Recent Labs  Lab 06/29/22 1524  WBC 19.0*  NEUTROABS 16.3*  HGB 17.0*  HCT 50.4*  MCV 95.5  PLT 175   Basic Metabolic Panel:  Recent Labs  Lab 06/29/22 1524  NA 139  K 4.3  CL 108  CO2 24  GLUCOSE 118*  BUN 16  CREATININE 0.92  CALCIUM 9.9   Lipid Panel: No results for input(s): "CHOL", "TRIG", "HDL", "CHOLHDL", "VLDL", "LDLCALC" in the last 168 hours. HgbA1c: No results for input(s): "HGBA1C" in the last 168 hours. Urine Drug Screen: No results for input(s): "LABOPIA", "COCAINSCRNUR", "LABBENZ", "AMPHETMU", "THCU", "LABBARB" in the last 168 hours.  Alcohol Level No results for input(s): "ETH" in the last 168 hours.  IMAGING past 24 hours MR ANGIO HEAD WO CONTRAST  Result Date: 06/30/2022 CLINICAL DATA:  76 year old female TIA. New left side numbness and tingling yesterday. Atrial fibrillation. EXAM: MRA HEAD WITHOUT CONTRAST TECHNIQUE: Angiographic images of the Circle of Willis were acquired using MRA technique without intravenous contrast. COMPARISON:  Brain MRI and neck MRA today reported separately. FINDINGS: Anterior circulation: Antegrade flow in both ICA siphons. No convincing siphon stenosis. Ophthalmic artery origins appear normal. Patent carotid termini, MCA and ACA origins. Anterior communicating artery is within normal limits. MCA bi/trifurcations appear patent without stenosis. Visible bilateral MCA and ACA branches are within normal limits. Posterior circulation: Antegrade flow in the posterior circulation with normal codominant distal vertebral arteries, vertebrobasilar  junction. Both PICA origins are patent. Patent basilar artery with tortuosity but no stenosis. Patent SCA and PCA origins. Posterior communicating arteries are diminutive or absent. Bilateral PCA branches are within normal limits. Anatomic variants: None. Other: Brain MRI today reported separately. IMPRESSION: Negative intracranial MRA. Electronically Signed   By: Genevie Ann M.D.   On: 06/30/2022 06:46   MR ANGIO NECK W WO CONTRAST  Result Date: 06/30/2022 CLINICAL DATA:  76 year old female TIA. New left side numbness and tingling yesterday. Atrial fibrillation. EXAM: MRA NECK WITHOUT AND WITH CONTRAST TECHNIQUE: Multiplanar and multiecho pulse sequences of the neck were obtained without and with intravenous contrast. Angiographic images of the neck were obtained using MRA technique without and with intravenous contrast. CONTRAST:  63m GADAVIST GADOBUTROL 1 MMOL/ML IV SOLN COMPARISON:  Brain MRI today reported separately. FINDINGS: Precontrast time-of-flight neck MRA images reveal antegrade flow in both cervical carotid and vertebral arteries. Both carotid bifurcations are patent. The vertebral arteries appear codominant, and patent along with both ICAs to the skull base. Post-contrast neck MRA images reveal a 3 vessel arch configuration. Proximal great vessels are within normal limits. Right CCA origin and right CCA are patent with minimal irregularity. Normal right carotid bifurcation. Cervical right ICA and visible right ICA siphon appear patent and within normal limits. Mildly tortuous left CCA without stenosis. Negative left carotid bifurcation. Cervical left ICA and visible left ICA siphon appear patent and within normal limits. Proximal subclavian arteries appear patent without stenosis. Both vertebral artery origins are patent and mildly tortuous, with subsequent decreased origin to  tail. But the codominant vertebral arteries are patent and enhance symmetrically to the vertebrobasilar junction without  stenosis. Grossly negative visible anterior and posterior circulation, see intracranial MRA reported separately. IMPRESSION: Normal for age MRA of the Neck. Electronically Signed   By: Genevie Ann M.D.   On: 06/30/2022 06:29   MR BRAIN WO CONTRAST  Result Date: 06/30/2022 CLINICAL DATA:  76 year old female TIA. New left side numbness and tingling yesterday. Atrial fibrillation. EXAM: MRI HEAD WITHOUT CONTRAST TECHNIQUE: Multiplanar, multiecho pulse sequences of the brain and surrounding structures were obtained without intravenous contrast. COMPARISON:  Report of head [facility] 03/15/1998 (no images available). FINDINGS: Brain: Cerebral volume is within normal limits for age. No restricted diffusion identified. No midline shift, mass effect, evidence of mass lesion, ventriculomegaly, extra-axial collection or acute intracranial hemorrhage. Cervicomedullary junction and pituitary are within normal limits. Patchy, widespread bilateral cerebral white matter T2 and FLAIR hyperintensity. But no cortical encephalomalacia or chronic cerebral blood products identified. Deep gray nuclei, brainstem and cerebellum appear normal for age. Vascular: Major intracranial vascular flow voids are preserved. Skull and upper cervical spine: Negative for age visible cervical spine. Visualized bone marrow signal is within normal limits. Sinuses/Orbits: Postoperative changes to both globes, otherwise negative orbits. Paranasal sinuses are well aerated. Other: Mild right mastoid air cell effusion. Left mastoids are clear. Negative visible nasopharynx. Grossly negative other visible internal auditory structures. Negative visible scalp and face. IMPRESSION: 1. No acute infarct or other acute intracranial abnormality. 2. Moderately advanced cerebral white matter signal changes, nonspecific but most commonly due to chronic small vessel disease. Electronically Signed   By: Genevie Ann M.D.   On:  06/30/2022 06:26   DG Skull Complete  Result Date: 06/30/2022 CLINICAL DATA:  Ear implant, hearing aid in left ear. Implant in right air, concern for metal. EXAM: SKULL - COMPLETE 4 + VIEW COMPARISON:  None Available. FINDINGS: There is no evidence of skull fracture or other focal bone lesions. A hearing aid with metallic densities is present at the left ear. No radiopaque abnormality at the right ear. IMPRESSION: 1. Hearing aid at left ear. 2. No radiopaque structure is seen at the right ear. . Electronically Signed   By: Brett Fairy M.D.   On: 06/30/2022 04:38   CT Angio Chest PE W and/or Wo Contrast  Result Date: 06/29/2022 CLINICAL DATA:  Weakness.  Atrial fibrillation. EXAM: CT ANGIOGRAPHY CHEST WITH CONTRAST TECHNIQUE: Multidetector CT imaging of the chest was performed using the standard protocol during bolus administration of intravenous contrast. Multiplanar CT image reconstructions and MIPs were obtained to evaluate the vascular anatomy. RADIATION DOSE REDUCTION: This exam was performed according to the departmental dose-optimization program which includes automated exposure control, adjustment of the mA and/or kV according to patient size and/or use of iterative reconstruction technique. CONTRAST:  83m OMNIPAQUE IOHEXOL 350 MG/ML SOLN COMPARISON:  Chest x-ray same day FINDINGS: Cardiovascular: Satisfactory opacification of the pulmonary arteries to the segmental level. No evidence of pulmonary embolism. Normal heart size. No pericardial effusion. Mediastinum/Nodes: No enlarged mediastinal, hilar, or axillary lymph nodes. Thyroid gland, trachea, and esophagus demonstrate no significant findings. Lungs/Pleura: There is minimal patchy atelectasis in the right lung base. The lungs are otherwise clear. No pleural effusion or pneumothorax. Upper Abdomen: Cholecystectomy clips are present. Musculoskeletal: No chest wall abnormality. No acute or significant osseous findings. Review of the MIP images  confirms the above findings. IMPRESSION: 1. No evidence for pulmonary embolism. 2. Minimal  patchy atelectasis in the right lung base. Electronically Signed   By: Ronney Asters M.D.   On: 06/29/2022 19:37   DG Chest 2 View  Result Date: 06/29/2022 CLINICAL DATA:  Dyspnea EXAM: CHEST - 2 VIEW COMPARISON:  None Available. FINDINGS: The heart size and mediastinal contours are within normal limits. Patchy airspace opacity in the periphery of the right lower lobe. Left lung is clear. No pleural effusion or pneumothorax. The visualized skeletal structures are unremarkable. IMPRESSION: Patchy airspace opacity in the periphery of the right lower lobe, suspicious for pneumonia. Follow-up to resolution is recommended. Electronically Signed   By: Davina Poke D.O.   On: 06/29/2022 15:55    PHYSICAL EXAM  Physical Exam  Constitutional: Appears well-developed and well-nourished.   Cardiovascular: Normal rate and regular rhythm.  Respiratory: Effort normal, non-labored breathing  Neuro: Mental Status: Patient is awake, alert, oriented to person, place, month, year, and situation. Patient is able to give a clear and coherent history. No signs of aphasia or neglect Cranial Nerves: II: Visual Fields are full. Pupils are equal, round, and reactive to light.   III,IV, VI: EOMI without ptosis or diploplia.  V: Facial sensation is symmetric to temperature VII: Facial movement is symmetric resting and smiling VIII: Hearing is intact to voice X: Palate elevates symmetrically XI: Shoulder shrug is symmetric. XII: Tongue protrudes midline without atrophy or fasciculations.  Motor: Tone is normal. Bulk is normal. 5/5 strength was present in all four extremities.  Sensory: Sensation is symmetric to light touch and temperature in the arms and legs. No extinction to DSS present.  Deep Tendon Reflexes: 2+ and symmetric in the biceps and patellae.  Plantars: Toes are downgoing bilaterally.  Cerebellar: FNF  and HKS are intact bilaterally    ASSESSMENT/PLAN [name] is a 76 y.o. female with history of deafness in her right ear, diverticulitis, HLD, and 3 weeks of cough (on Keflex for PNAx 5 days)  presenting with weakness, lethargy, and a sudden onset of vertigo. She called EMS today for persistent generalized weakness and then noted that she was in new onset A-fib with RVR with significant tachycardia.  She was also feeling very nauseous and dehydrated.  A-fib spontaneously resolved and she went back to normal sinus rhythm.  Possible TIA in the setting of newly diagnosed afib   MRI   No acute infarct MRA  Negative intracranial MRA 2D Echo EF 65 to 70% LDL 141 HgbA1c 5.8 VTE prophylaxis - lovenox aspirin 81 mg daily prior to admission, now on Eliquis (apixaban) daily.  Therapy recommendations:  None Disposition:  Discharge home  Atrial Fibrillation, new diagnosis Okay to start anticoagulation, MRI negative Now on Eliquis '5mg'$  twice daily Cardiology consulted of outpatient follow up  Hypertension Home meds:  None Stable Long-term BP goal normotensive  Hyperlipidemia Home meds:  None LDL 141, goal < 70 Add Crestor '20mg'$   Continue statin at discharge  Other Stroke Risk Factors Advanced Age >/= 55   Other Active Problems Right ear deafness Leukocytosis, WBC 19.0 Pneumonia - cough, lethargy and shortness of breath for 3 weeks Was treated with Keflex but not effective Now on azithromycin and ceftriaxone  Hospital day # 0  Patient seen and examined by NP/APP with MD. MD to update note as needed.   Janine Ores, DNP, FNP-BC [facility] Pager: [phone]  ATTENDING NOTE: I reviewed above note and agree with the assessment and plan. Pt was seen and examined.   76 year old female with history  of right ear deafness, hyperlipidemia had 3 weeks of cough, lethargy and shortness of breath at home.  Was treated with Keflex for 5 days but not effective.   Developed episode of vertigo, imbalance and left hand numbness for 2 hours.  Currently symptoms resolved.  However ER was found to have A-fib RVR but now self converted back to sinus rhythm.  MRI, MRA head and neck unremarkable.  EF 65 to 70%.  LDL 141, A1c 5.8, creatinine 0.92.  WBCs 19.0, hemoglobin 17.0.  Blood culture pending.  On exam, patient no family at bedside, she still lethargic but no focal neurologic deficit.  Etiology for patient symptoms concerning for TIA, which can be caused by newly diagnosed A-fib.  MRI negative, discussed with patient longtime regarding anticoagulation, family she agrees to be on Eliquis 5 mg twice daily.  On Rocephin and azithromycin for pneumonia treatment, management per primary team.  LDL elevated, currently on Crestor 20, continue on discharge.  PT/OT no recommendation.  For detailed assessment and plan, please refer to above/below as I have made changes wherever appropriate.   Neurology will sign off. Please call with questions. Pt will follow up with Dr. Rexene Alberts at [facility] in about 4 weeks. Thanks for the consult.   Rosalin Hawking, MD PhD Stroke Neurology 06/30/2022 10:03 PM     To contact Stroke Continuity provider, please refer to [email or web address] After hours, contact General Neurology

## 2022-06-30 NOTE — ED Notes (Signed)
Pt is eating lunch tray.

## 2022-06-30 NOTE — Consult Note (Addendum)
NEUROLOGY CONSULTATION NOTE   Date of service: June 30, 2022 Patient Name: Kelly Burns MRN:  [id number] DOB:  Dec 10, 1945 Reason for consult: "L sided numbness, new onset Afibb" Requesting Provider: Etta Quill, DO _ _ _   _ __   _ __ _ _  __ __   _ __   __ _  History of Present Illness  Kelly Burns is a 76 y.o. female with PMH significant for deafness in her right ear, diverticulitis, hyperlipidemia who presents with 3 weeks history of cough, generalized weakness and lethargy along with shortness of breath.  She reports that she saw her doctor about 3 weeks ago for this cough was started on Keflex for 5 days when she finished her 5-day course, the cough is persistent.  She reports that on Saturday, she woke up in the morning feeling fine.  She was cleaning the house and around 9 AM, she had sudden onset vertigo and unsteadiness that was unrelenting along with numbness in her left index and middle finger.  They both started the same time.  Around 930, she called her daughter and she laid on the bed.  Daughter called her at 74 and she felt that the vertigo was somewhat improved and the numbness was also improved.  She went back to sleep and woke up at 1130 and her symptoms are completely resolved.  She called EMS today for persistent generalized weakness and then noted that she was in new onset A-fib with RVR with significant tachycardia.  She was also feeling very nauseous and dehydrated.  A-fib spontaneously resolved and she went back to normal sinus rhythm.  LKW: 06/28/2022 at 0900 mRS: 0 tNKASE: Not offered due to spontaneous resolution of symptoms. Thrombectomy: Not offered due to spontaneous resolution of symptoms. NIHSS components Score: Comment  1a Level of Conscious 0'[x]'$  1'[]'$  2'[]'$  3'[]'$      1b LOC Questions 0'[x]'$  1'[]'$  2'[]'$       1c LOC Commands 0'[x]'$  1'[]'$  2'[]'$       2 Best Gaze 0'[x]'$  1'[]'$  2'[]'$       3 Visual 0'[x]'$  1'[]'$  2'[]'$  3'[]'$      4 Facial Palsy 0'[x]'$  1'[]'$  2'[]'$  3'[]'$      5a Motor Arm -  left 0'[x]'$  1'[]'$  2'[]'$  3'[]'$  4'[]'$  UN'[]'$    5b Motor Arm - Right 0'[x]'$  1'[]'$  2'[]'$  3'[]'$  4'[]'$  UN'[]'$    6a Motor Leg - Left 0'[x]'$  1'[]'$  2'[]'$  3'[]'$  4'[]'$  UN'[]'$    6b Motor Leg - Right 0'[x]'$  1'[]'$  2'[]'$  3'[]'$  4'[]'$  UN'[]'$    7 Limb Ataxia 0'[x]'$  1'[]'$  2'[]'$  3'[]'$  UN'[]'$     8 Sensory 0'[x]'$  1'[]'$  2'[]'$  UN'[]'$      9 Best Language 0'[x]'$  1'[]'$  2'[]'$  3'[]'$      10 Dysarthria 0'[x]'$  1'[]'$  2'[]'$  UN'[]'$      11 Extinct. and Inattention 0'[x]'$  1'[]'$  2'[]'$       TOTAL: 0       ROS   Constitutional Denies weight loss, fever and chills.   HEENT Denies changes in vision and hearing.   Respiratory Denies SOB and cough.   CV Denies palpitations and CP   GI + abdominal pain, nausea, vomiting and diarrhea.   GU Denies dysuria and urinary frequency.   MSK Denies myalgia and joint pain.   Skin Denies rash and pruritus.   Neurological Denies headache and syncope.   Psychiatric Denies recent changes in mood. Denies anxiety and depression.    Past History   Past Medical History:  Diagnosis Date   Deafness in right ear  Diverticulitis    Hyperlipidemia    Past Surgical History:  Procedure Laterality Date   APPENDECTOMY     BREAST BIOPSY Right    CHOLECYSTECTOMY     MIDDLE EAR SURGERY     TONSILLECTOMY     Family History  Problem Relation Age of Onset   Stroke Mother    Heart disease Mother    Dementia Father    Cancer Maternal Aunt        colon   Cancer Maternal Grandmother        breast   Breast cancer Maternal Grandmother    Breast cancer Cousin    Social History   Socioeconomic History   Marital status: Widowed    Spouse name: Not on file   Number of children: 3   Years of education: Not on file   Highest education level: Some college, no degree  Occupational History   Not on file  Tobacco Use   Smoking status: Former    Types: Cigars   Smokeless tobacco: Never  Vaping Use   Vaping Use: Never used  Substance and Sexual Activity   Alcohol use: Yes    Comment: social   Drug use: No   Sexual activity: Never  Other Topics Concern   Not on file   Social History Narrative   Lives at home alone    Right handed   Caffeine: decaf coffee 2 cups/day   Social Determinants of Health   Financial Resource Strain: Not on file  Food Insecurity: Not on file  Transportation Needs: Not on file  Physical Activity: Not on file  Stress: Not on file  Social Connections: Not on file   Allergies  Allergen Reactions   Bactrim [Sulfamethoxazole-Trimethoprim] Rash    Rash broke out on stomach   Other Itching and Rash    CSF-HC powder applied to the mastoid cavity results in severe itching and ear drainage.    Medications  (Not in a hospital admission)    Vitals   Vitals:   06/29/22 2330 06/30/22 0000 06/30/22 0030 06/30/22 0130  BP: 137/68 119/77 (!) 114/57 114/62  Pulse: 95 91 87 79  Resp: '18 20 15 15  '$ Temp:      TempSrc:      SpO2: 93% 91% 91% 95%     There is no height or weight on file to calculate BMI.  Physical Exam   General: Laying comfortably in bed; in no acute distress.  HENT: Normal oropharynx and mucosa. Normal external appearance of ears and nose.  Neck: Supple, no pain or tenderness  CV: No JVD. No peripheral edema.  Pulmonary: Symmetric Chest rise. Normal respiratory effort.  Abdomen: Soft to touch, non-tender.  Ext: No cyanosis, edema, or deformity  Skin: No rash. Normal palpation of skin.   Musculoskeletal: Normal digits and nails by inspection. No clubbing.   Neurologic Examination  Mental status/Cognition: Alert, oriented to self, place, month and year, good attention.  Speech/language: Fluent, comprehension intact, object naming intact, repetition intact.  Cranial nerves:   CN II Pupils equal and reactive to light, no VF deficits    CN III,IV,VI EOM intact, no gaze preference or deviation, no nystagmus    CN V normal sensation in V1, V2, and V3 segments bilaterally    CN VII no asymmetry, no nasolabial fold flattening    CN VIII normal hearing to speech    CN IX & X normal palatal elevation, no  uvular deviation    CN XI 5/5  head turn and 5/5 shoulder shrug bilaterally   CN XII midline tongue protrusion    Motor:  Muscle bulk: normal, tone normal, pronator drift none tremor none Mvmt Root Nerve  Muscle Right Left Comments  SA C5/6 Ax Deltoid 5 5   EF C5/6 Mc Biceps 5 5   EE C6/7/8 Rad Triceps 5 5   WF C6/7 Med FCR     WE C7/8 PIN ECU     F Ab C8/T1 U ADM/FDI 5 5   HF L1/2/3 Fem Illopsoas 5 5   KE L2/3/4 Fem Quad 5 5   DF L4/5 D Peron Tib Ant 5 5   PF S1/2 Tibial Grc/Sol 5 5    Sensation:  Light touch Intact throughout   Pin prick    Temperature    Vibration   Proprioception    Coordination/Complex Motor:  - Finger to Nose intact bilaterally - Heel to shin bilaterally - Rapid alternating movement are normal - Gait: Deferred. Labs   CBC:  Recent Labs  Lab 06/29/22 1524  WBC 19.0*  NEUTROABS 16.3*  HGB 17.0*  HCT 50.4*  MCV 95.5  PLT 093    Basic Metabolic Panel:  Lab Results  Component Value Date   NA 139 06/29/2022   K 4.3 06/29/2022   CO2 24 06/29/2022   GLUCOSE 118 (H) 06/29/2022   BUN 16 06/29/2022   CREATININE 0.92 06/29/2022   CALCIUM 9.9 06/29/2022   GFRNONAA >60 06/29/2022   GFRAA >89 05/24/2014   Lipid Panel:  Lab Results  Component Value Date   LDLCALC 94 02/17/2014   HgbA1c: No results found for: "HGBA1C" Urine Drug Screen: No results found for: "LABOPIA", "COCAINSCRNUR", "LABBENZ", "AMPHETMU", "THCU", "LABBARB"  Alcohol Level No results found for: "ETH"  MR Angio head without contrast and MR angio neck with contrast: pending  MRI Brain: pending  Impression   Kelly Burns is a 76 y.o. 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  Has had vertigo in the past no prior history of focal symptoms  associated with her vertigo in the past.  Also felt the vertigo was persistent this time with some unsteadiness.  The episode on Saturday is concerning for a TIA.  I discussed this with patient and with her daughter at the bedside and recommended that with her new onset A-fib with RVR, we should strongly consider anticoagulation. However, patient is hesitant about starting anticoagulation as she is worried about bleeding.  I did discuss with her the disabling nature is of stroke.  She would like to wait for the MRI before considering anticoagulation.  She is okay with aspirin at this time and with full stroke work-up.  Primary Diagnosis:  Cerebral infarction, unspecified.  Secondary Diagnosis: Paroxysmal atrial fibrillation, hyperlipidemia.  Recommendations   - Frequent Neuro checks per stroke unit protocol - Recommend brain imaging with MRI Brain without contrast - Recommend Vascular imaging with MRA Angio Head without contrast and MR angio neck with contrast - Recommend obtaining TTE - Recommend obtaining Lipid panel with LDL - Please start statin if LDL > 70 - Recommend HbA1c - Antithrombotic - Aspirin '81mg'$  daily. Discuss AC with patient tomorrow and if agreeable, discontinue AP and dvt prophylaxis. - Recommend DVT ppx - SBP goal - permissive hypertension first 24 h <  220/110. Held home meds.  - Recommend Telemetry monitoring for arrythmia - Recommend bedside swallow screen prior to PO intake. - Stroke education booklet - Recommend PT/OT/SLP consult   ______________________________________________________________________   Thank you for the opportunity to take part in the care of this patient. If you have any further questions, please contact the neurology consultation attending.  Signed,  [place] Pager Number [phone] _ _ _   _ __   _ __ _ _  __ __   _ __   __ _

## 2022-06-30 NOTE — Discharge Summary (Signed)
Physician Discharge Summary   Patient: Kelly Burns MRN: [id number] DOB: Jun 07, 1946  Admit date:     06/29/2022  Discharge date: 06/30/22  Discharge Physician: Patrecia Pour   PCP: Merrilee Seashore, MD   Recommendations at discharge:  Follow up with cardiology for paroxysmal atrial fibrillation which converted spontaneously. Overnight there were isolated missed sinus beats as well. Will place heart monitor after discharge. Follow up with neurology, Dr. Rexene Alberts, in 4-8 weeks for TIA.  Follow up with PCP for chronic wellness exams and to recheck LFTs and lipid panel. Started rosuvastatin 2.'5mg'$  daily for dyslipidemia, LDL 141. Has intolerance to atorvastatin in the past. Consider metformin for HbA1c 5.8%. Lifestyle changes discussed.  Discharge Diagnoses: Principal Problem:   Nausea vomiting and diarrhea Active Problems:   Community acquired pneumonia of right lower lobe of lung is RULED OUT   PAF (paroxysmal atrial fibrillation) (HCC)   Sinus pause   Left sided numbness  Hospital Course: ONNIE ALATORRE is a 76 y.o. female with a history of HLD intolerant of atorvastatin, essential tremor, recent viral respiratory illness and suspected superimposed pneumonia who presented to the ED by EMS on 10/15 feeling weak with nausea, vomiting and diarrhea since taking keflex, also having some cough for many weeks. EMS found her to have rapid irregular heart rhythm confirmed to be atrial fibrillation with ventricular rate of 142bpm on admission ECG. This spontaneously converted prior to any intervention to sinus rhythm. Work up also showed WBC 19k, LA normal. CXR demonstrated RLL opacity better defined as minimal patchy atelectasis on CTA chest which showed no PE.   Antibiotic initially given for concern of PNA, though with minimal CT findings, resolution of leukocytosis over the course of hours with IVF, and undetectable PCT as well as minimal respiratory symptoms, this is felt to be unlikely.  Overnight on telemetry she demonstrated intermittent isolated single missed sinus beats without pause which have not recurred while the patient is awake.  She reported transient episode a couple days earlier with dizziness associated with numbness of her left 2nd, 3rd digits and up her arm at which time her daughter reported finding an irregular pulse. This raised concern for TIA in the setting of AFib. CT, then subsequent MRI revealed no acute infarcts. There was moderately advanced white matter signal change suggestive of chronic small vessel ischemic disease on MRI. Neurology was consulted, recommending statin and eliquis.   Cardiology was curbsided, confirmed atrial fibrillation, and arranged for outpatient cardiac monitoring and follow up.   Assessment and Plan: TIA: Transient LUE numbness, dizziness concerning for TIA in setting of AFib. Also suggestion of small vessel disease without neuroimaging findings of acute process.  PAF with RVR: New diagnosis, spontaneously converted back to NSR. Sounds like she also had irregular rhythm days prior. No cardiac history and echo, albeit limited views) showed no abnormalities.  - Will have cardiac monitor placed prior to discharge and follow up with cardiology.  - No negative chronotropes given during admission nor prescribed at discharge. Note also nocturnal missed beats without pauses or high grade AV block.  - CHA2DS2-VASc score is 32 (age, sex, TIA) and will be 5 technically on 12/9. Given this, both I and Dr. Erlinda Hong had an extensive discussion with a reluctant patient about anticoagulation. She has no history of bleeding and I believe the risk of bleeding is outweighed by stroke risk reduction in her case, at least in this earlier period. She, and her daughters, understand risks benefits and alternatives and will  proceed with eliquis.   Dyslipidemia: LDL low, HDL 141 with goal < 70. Has had intolerance to atorvastatin previously.  - We discussed likely  better tolerated rosuvastatin which we will start at 2.'5mg'$  dose.  - She will also enact lifestyle changes and stop the drug if she has myalgias.   Prediabetes: HbA1c 5.8%.  - PCP follow up.  - Lifestyle modifications as above.  Nausea vomiting and diarrhea: May be related to keflex, though symptoms have resolved. Will Rx prn zofran, though pt is able to maintain hydration and nutrition enterally.   CAP ruled out, now believe opacities are atelectasis vs. resolving infiltrate from prior infection(s).  - No further treatment planned.   Consultants: Neurology, Dr. Erlinda Hong. Dr. Gardiner Rhyme of cardiology assisted with aspects of care but was not formally consulted. Procedures performed: Echo  Disposition: Home Diet recommendation:  Cardiac and Carb modified diet DISCHARGE MEDICATION: Allergies as of 06/30/2022       Reactions   Bactrim [sulfamethoxazole-trimethoprim] Rash   Rash broke out on stomach   Other Itching, Rash   CSF-HC powder applied to the mastoid cavity results in severe itching and ear drainage.        Medication List     TAKE these medications    ALIGN PO Take 1 capsule by mouth every other day.   apixaban 5 MG Tabs tablet Commonly known as: ELIQUIS Take 1 tablet (5 mg total) by mouth 2 (two) times daily.   calcium-vitamin D 500-5 MG-MCG tablet Commonly known as: OSCAL WITH D Take 1 tablet by mouth daily with breakfast.   LORazepam 1 MG tablet Commonly known as: ATIVAN Take 0.5 mg by mouth at bedtime as needed for sleep.   multivitamin tablet Take 1 tablet by mouth daily.   Premarin vaginal cream Generic drug: conjugated estrogens Place 1 g vaginally once a week.   rosuvastatin 5 MG tablet Commonly known as: Crestor Take 0.5 tablets (2.5 mg total) by mouth daily.        Follow-up Information     Donato Heinz, MD Follow up on 08/12/2022.   Specialties: Cardiology, Radiology Why: at 8:40 AM Contact information: [address] [place] [address] [phone]         Merrilee Seashore, MD Follow up.   Specialty: Internal Medicine Contact information: [address] [place] [address] [phone]         Star Age, MD. Schedule an appointment as soon as possible for a visit in 1 month(s).   Specialties: Neurology, Radiology Contact information: [address] [place] [address] [phone]                Discharge Exam: Danley Danker Weights   06/30/22 1510  Weight: 91.3 kg  BP 116/67   Pulse 73   Temp (!) 97.5 F (36.4 C) (Axillary)   Resp 10   Ht '5\' 9"'$  (1.753 m)   Wt 91.3 kg   SpO2 98%   BMI 29.72 kg/m   No distress RRR, no MRG, JVD, or pitting edema Clear, nonlabored Normal speech and strength grossly  Condition at discharge: stable  The results of significant diagnostics from this hospitalization (including imaging, microbiology, ancillary and laboratory) are listed below for reference.   Imaging Studies: ECHOCARDIOGRAM COMPLETE  Result Date: 06/30/2022    ECHOCARDIOGRAM REPORT   Patient Name:   RAHA TENNISON Date of Exam: 06/30/2022 Medical Rec #:  [id number]        Height:  70.0 in Accession #:    [id number]       Weight:       199.5 lb Date of Birth:  08/08/1946        BSA:          2.085 m Patient Age:    25 years         BP:           115/62 mmHg Patient Gender: F                HR:           70 bpm. Exam Location:  Inpatient Procedure: 2D Echo, Color Doppler and Intracardiac Opacification Agent Indications:    Atrial fibrillation  History:        Patient has no prior history of Echocardiogram examinations.                 Risk Factors:Dyslipidemia and Former Smoker.  Sonographer:    Eartha Inch Referring Phys: Etta Quill  Sonographer Comments: Technically difficult study due to poor echo windows and patient is obese. Image acquisition challenging due to patient body habitus and Image acquisition challenging due to  respiratory motion. IMPRESSIONS  1. Limited study due to poor echo windows.  2. Left ventricular ejection fraction, by estimation, is 65 to 70%. The left ventricle has normal function. The left ventricle has no regional wall motion abnormalities. Diastolic function indeterminant as no tissue doppler of MV annulus performed.  3. Right ventricular systolic function was not well visualized. The right ventricular size is not well visualized.  4. The mitral valve is grossly normal. Trivial mitral valve regurgitation. No evidence of mitral stenosis.  5. The aortic valve is tricuspid. Aortic valve regurgitation is not visualized. Aortic valve sclerosis is present, with no evidence of aortic valve stenosis. FINDINGS  Left Ventricle: Left ventricular ejection fraction, by estimation, is 65 to 70%. The left ventricle has normal function. The left ventricle has no regional wall motion abnormalities. Definity contrast agent was given IV to delineate the left ventricular  endocardial borders. The left ventricular internal cavity size was normal in size. There is no left ventricular hypertrophy. Diastolic function indeterminant as no tissue doppler of MV annulus performed. Right Ventricle: The right ventricular size is not well visualized. Right vetricular wall thickness was not well visualized. Right ventricular systolic function was not well visualized. Left Atrium: Left atrial size was not well visualized. Right Atrium: Right atrial size was not well visualized. Pericardium: Trivial pericardial effusion is present. The pericardial effusion is circumferential. Mitral Valve: The mitral valve is grossly normal. Trivial mitral valve regurgitation. No evidence of mitral valve stenosis. Tricuspid Valve: The tricuspid valve is normal in structure. Tricuspid valve regurgitation is trivial. Aortic Valve: The aortic valve is tricuspid. Aortic valve regurgitation is not visualized. Aortic valve sclerosis is present, with no evidence of  aortic valve stenosis. Pulmonic Valve: The pulmonic valve was normal in structure. Pulmonic valve regurgitation is not visualized. Aorta: The aortic root and ascending aorta are structurally normal, with no evidence of dilitation. IAS/Shunts: The atrial septum is grossly normal.  LEFT VENTRICLE PLAX 2D LVIDd:         4.60 cm LVIDs:         3.20 cm LV PW:         0.70 cm LV IVS:        0.70 cm LVOT diam:     1.90 cm LV SV:  77 LV SV Index:   37 LVOT Area:     2.84 cm  LV Volumes (MOD) LV vol d, MOD A2C: 23.6 ml LV vol d, MOD A4C: 41.1 ml LV vol s, MOD A2C: 6.2 ml LV vol s, MOD A4C: 11.6 ml LV SV MOD A2C:     17.4 ml LV SV MOD A4C:     41.1 ml LV SV MOD BP:      23.0 ml RIGHT VENTRICLE RV S prime:     11.20 cm/s LEFT ATRIUM             Index        RIGHT ATRIUM           Index LA diam:        3.50 cm 1.68 cm/m   RA Area:     12.50 cm LA Vol (A2C):   33.9 ml 16.26 ml/m  RA Volume:   31.30 ml  15.01 ml/m LA Vol (A4C):   24.7 ml 11.85 ml/m LA Biplane Vol: 29.6 ml 14.20 ml/m  AORTIC VALVE LVOT Vmax:   129.00 cm/s LVOT Vmean:  98.600 cm/s LVOT VTI:    0.270 m  AORTA Ao Root diam: 2.70 cm Ao Asc diam:  2.60 cm MITRAL VALVE MV Area (PHT): 3.03 cm    SHUNTS MV Decel Time: 250 msec    Systemic VTI:  0.27 m MV E velocity: 93.00 cm/s  Systemic Diam: 1.90 cm MV A velocity: 88.10 cm/s MV E/A ratio:  1.06 Gwyndolyn Kaufman MD Electronically signed by Gwyndolyn Kaufman MD Signature Date/Time: 06/30/2022/11:39:04 AM    Final    MR ANGIO HEAD WO CONTRAST  Result Date: 06/30/2022 CLINICAL DATA:  76 year old female TIA. New left side numbness and tingling yesterday. Atrial fibrillation. EXAM: MRA HEAD WITHOUT CONTRAST TECHNIQUE: Angiographic images of the Circle of Willis were acquired using MRA technique without intravenous contrast. COMPARISON:  Brain MRI and neck MRA today reported separately. FINDINGS: Anterior circulation: Antegrade flow in both ICA siphons. No convincing siphon stenosis. Ophthalmic artery  origins appear normal. Patent carotid termini, MCA and ACA origins. Anterior communicating artery is within normal limits. MCA bi/trifurcations appear patent without stenosis. Visible bilateral MCA and ACA branches are within normal limits. Posterior circulation: Antegrade flow in the posterior circulation with normal codominant distal vertebral arteries, vertebrobasilar junction. Both PICA origins are patent. Patent basilar artery with tortuosity but no stenosis. Patent SCA and PCA origins. Posterior communicating arteries are diminutive or absent. Bilateral PCA branches are within normal limits. Anatomic variants: None. Other: Brain MRI today reported separately. IMPRESSION: Negative intracranial MRA. Electronically Signed   By: Genevie Ann M.D.   On: 06/30/2022 06:46   MR ANGIO NECK W WO CONTRAST  Result Date: 06/30/2022 CLINICAL DATA:  76 year old female TIA. New left side numbness and tingling yesterday. Atrial fibrillation. EXAM: MRA NECK WITHOUT AND WITH CONTRAST TECHNIQUE: Multiplanar and multiecho pulse sequences of the neck were obtained without and with intravenous contrast. Angiographic images of the neck were obtained using MRA technique without and with intravenous contrast. CONTRAST:  71m GADAVIST GADOBUTROL 1 MMOL/ML IV SOLN COMPARISON:  Brain MRI today reported separately. FINDINGS: Precontrast time-of-flight neck MRA images reveal antegrade flow in both cervical carotid and vertebral arteries. Both carotid bifurcations are patent. The vertebral arteries appear codominant, and patent along with both ICAs to the skull base. Post-contrast neck MRA images reveal a 3 vessel arch configuration. Proximal great vessels are within normal limits. Right CCA origin and right CCA are  patent with minimal irregularity. Normal right carotid bifurcation. Cervical right ICA and visible right ICA siphon appear patent and within normal limits. Mildly tortuous left CCA without stenosis. Negative left carotid  bifurcation. Cervical left ICA and visible left ICA siphon appear patent and within normal limits. Proximal subclavian arteries appear patent without stenosis. Both vertebral artery origins are patent and mildly tortuous, with subsequent decreased origin to tail. But the codominant vertebral arteries are patent and enhance symmetrically to the vertebrobasilar junction without stenosis. Grossly negative visible anterior and posterior circulation, see intracranial MRA reported separately. IMPRESSION: Normal for age MRA of the Neck. Electronically Signed   By: Genevie Ann M.D.   On: 06/30/2022 06:29   MR BRAIN WO CONTRAST  Result Date: 06/30/2022 CLINICAL DATA:  76 year old female TIA. New left side numbness and tingling yesterday. Atrial fibrillation. EXAM: MRI HEAD WITHOUT CONTRAST TECHNIQUE: Multiplanar, multiecho pulse sequences of the brain and surrounding structures were obtained without intravenous contrast. COMPARISON:  Report of head [facility] 03/15/1998 (no images available). FINDINGS: Brain: Cerebral volume is within normal limits for age. No restricted diffusion identified. No midline shift, mass effect, evidence of mass lesion, ventriculomegaly, extra-axial collection or acute intracranial hemorrhage. Cervicomedullary junction and pituitary are within normal limits. Patchy, widespread bilateral cerebral white matter T2 and FLAIR hyperintensity. But no cortical encephalomalacia or chronic cerebral blood products identified. Deep gray nuclei, brainstem and cerebellum appear normal for age. Vascular: Major intracranial vascular flow voids are preserved. Skull and upper cervical spine: Negative for age visible cervical spine. Visualized bone marrow signal is within normal limits. Sinuses/Orbits: Postoperative changes to both globes, otherwise negative orbits. Paranasal sinuses are well aerated. Other: Mild right mastoid air cell effusion. Left mastoids are clear.  Negative visible nasopharynx. Grossly negative other visible internal auditory structures. Negative visible scalp and face. IMPRESSION: 1. No acute infarct or other acute intracranial abnormality. 2. Moderately advanced cerebral white matter signal changes, nonspecific but most commonly due to chronic small vessel disease. Electronically Signed   By: Genevie Ann M.D.   On: 06/30/2022 06:26   DG Skull Complete  Result Date: 06/30/2022 CLINICAL DATA:  Ear implant, hearing aid in left ear. Implant in right air, concern for metal. EXAM: SKULL - COMPLETE 4 + VIEW COMPARISON:  None Available. FINDINGS: There is no evidence of skull fracture or other focal bone lesions. A hearing aid with metallic densities is present at the left ear. No radiopaque abnormality at the right ear. IMPRESSION: 1. Hearing aid at left ear. 2. No radiopaque structure is seen at the right ear. . Electronically Signed   By: Brett Fairy M.D.   On: 06/30/2022 04:38   CT Angio Chest PE W and/or Wo Contrast  Result Date: 06/29/2022 CLINICAL DATA:  Weakness.  Atrial fibrillation. EXAM: CT ANGIOGRAPHY CHEST WITH CONTRAST TECHNIQUE: Multidetector CT imaging of the chest was performed using the standard protocol during bolus administration of intravenous contrast. Multiplanar CT image reconstructions and MIPs were obtained to evaluate the vascular anatomy. RADIATION DOSE REDUCTION: This exam was performed according to the departmental dose-optimization program which includes automated exposure control, adjustment of the mA and/or kV according to patient size and/or use of iterative reconstruction technique. CONTRAST:  17m OMNIPAQUE IOHEXOL 350 MG/ML SOLN COMPARISON:  Chest x-ray same day FINDINGS: Cardiovascular: Satisfactory opacification of the pulmonary arteries to the segmental level. No evidence of pulmonary embolism. Normal heart size. No pericardial effusion. Mediastinum/Nodes: No enlarged mediastinal, hilar, or axillary lymph  nodes.  Thyroid gland, trachea, and esophagus demonstrate no significant findings. Lungs/Pleura: There is minimal patchy atelectasis in the right lung base. The lungs are otherwise clear. No pleural effusion or pneumothorax. Upper Abdomen: Cholecystectomy clips are present. Musculoskeletal: No chest wall abnormality. No acute or significant osseous findings. Review of the MIP images confirms the above findings. IMPRESSION: 1. No evidence for pulmonary embolism. 2. Minimal patchy atelectasis in the right lung base. Electronically Signed   By: Ronney Asters M.D.   On: 06/29/2022 19:37   DG Chest 2 View  Result Date: 06/29/2022 CLINICAL DATA:  Dyspnea EXAM: CHEST - 2 VIEW COMPARISON:  None Available. FINDINGS: The heart size and mediastinal contours are within normal limits. Patchy airspace opacity in the periphery of the right lower lobe. Left lung is clear. No pleural effusion or pneumothorax. The visualized skeletal structures are unremarkable. IMPRESSION: Patchy airspace opacity in the periphery of the right lower lobe, suspicious for pneumonia. Follow-up to resolution is recommended. Electronically Signed   By: Davina Poke D.O.   On: 06/29/2022 15:55    Microbiology: Results for orders placed or performed during the hospital encounter of 06/29/22  Culture, blood (routine x 2)     Status: None (Preliminary result)   Collection Time: 06/29/22  4:33 PM   Specimen: BLOOD RIGHT HAND  Result Value Ref Range Status   Specimen Description BLOOD RIGHT HAND  Final   Special Requests   Final    BOTTLES DRAWN AEROBIC AND ANAEROBIC Blood Culture results may not be optimal due to an inadequate volume of blood received in culture bottles   Culture   Final    NO GROWTH < 12 HOURS Performed at [facility], [place] [address], [place], [place] [address]    Report Status PENDING  Incomplete  Culture, blood (routine x 2)     Status: None (Preliminary result)   Collection Time: 06/29/22  4:38 PM   Specimen:  BLOOD LEFT HAND  Result Value Ref Range Status   Specimen Description BLOOD LEFT HAND  Final   Special Requests   Final    BOTTLES DRAWN AEROBIC AND ANAEROBIC Blood Culture results may not be optimal due to an inadequate volume of blood received in culture bottles   Culture   Final    NO GROWTH < 12 HOURS Performed at [facility], [place] [address], [place], [place] [address]    Report Status PENDING  Incomplete  Resp Panel by RT-PCR (Flu A&B, Covid) Anterior Nasal Swab     Status: None   Collection Time: 06/29/22  7:37 PM   Specimen: Anterior Nasal Swab  Result Value Ref Range Status   SARS Coronavirus 2 by RT PCR NEGATIVE NEGATIVE Final    Comment: (NOTE) SARS-CoV-2 target nucleic acids are NOT DETECTED.  The SARS-CoV-2 RNA is generally detectable in upper respiratory specimens during the acute phase of infection. The lowest concentration of SARS-CoV-2 viral copies this assay can detect is 138 copies/mL. A negative result does not preclude SARS-Cov-2 infection and should not be used as the sole basis for treatment or other patient management decisions. A negative result may occur with  improper specimen collection/handling, submission of specimen other than nasopharyngeal swab, presence of viral mutation(s) within the areas targeted by this assay, and inadequate number of viral copies(<138 copies/mL). A negative result must be combined with clinical observations, patient history, and epidemiological information. The expected result is Negative.  Fact Sheet for Patients:  EntrepreneurPulse.com.au  Fact Sheet  for Healthcare Providers:  IncredibleEmployment.be  This test is no t yet approved or cleared by the [place] and  has been authorized for detection and/or diagnosis of SARS-CoV-2 by FDA under an Emergency Use Authorization (EUA). This EUA will remain  in effect (meaning this test can be used) for the duration of  the COVID-19 declaration under Section 564(b)(1) of the Act, 21 U.S.C.section 360bbb-3(b)(1), unless the authorization is terminated  or revoked sooner.       Influenza A by PCR NEGATIVE NEGATIVE Final   Influenza B by PCR NEGATIVE NEGATIVE Final    Comment: (NOTE) The Xpert Xpress SARS-CoV-2/FLU/RSV plus assay is intended as an aid in the diagnosis of influenza from Nasopharyngeal swab specimens and should not be used as a sole basis for treatment. Nasal washings and aspirates are unacceptable for Xpert Xpress SARS-CoV-2/FLU/RSV testing.  Fact Sheet for Patients: EntrepreneurPulse.com.au  Fact Sheet for Healthcare Providers: IncredibleEmployment.be  This test is not yet approved or cleared by the [place] FDA and has been authorized for detection and/or diagnosis of SARS-CoV-2 by FDA under an Emergency Use Authorization (EUA). This EUA will remain in effect (meaning this test can be used) for the duration of the COVID-19 declaration under Section 564(b)(1) of the Act, 21 U.S.C. section 360bbb-3(b)(1), unless the authorization is terminated or revoked.  Performed at [facility], [place] [address], [place], [place] [address]     Labs: CBC: Recent Labs  Lab 06/29/22 1524 06/30/22 1105  WBC 19.0* 9.9  NEUTROABS 16.3*  --   HGB 17.0* 13.4  HCT 50.4* 41.2  MCV 95.5 97.9  PLT 365 465   Basic Metabolic Panel: Recent Labs  Lab 06/29/22 1524 06/30/22 1105  NA 139 139  K 4.3 3.8  CL 108 108  CO2 24 24  GLUCOSE 118* 92  BUN 16 12  CREATININE 0.92 0.78  CALCIUM 9.9 9.1   Liver Function Tests: Recent Labs  Lab 06/29/22 1524  AST 22  ALT 25  ALKPHOS 75  BILITOT 0.7  PROT 6.5  ALBUMIN 4.2   CBG: No results for input(s): "GLUCAP" in the last 168 hours.  Discharge time spent: greater than 30 minutes.  Signed: Patrecia Pour, MD [facility] 06/30/2022

## 2022-06-30 NOTE — Discharge Instructions (Addendum)
   Heart Monitor:   Length of Wear: 14 days   Your monitor will be placed in ER on your chest.  However, if you have not received your monitor after 5 business days please send [place] a MyChart message or call the office at [phone], so we may follow up on this for you.    Your physician has recommended that you wear a Zio AT monitor.    This monitor is a medical device that records the heart's electrical activity. Doctors most often use these monitors to diagnose arrhythmias. Arrhythmias are problems with the speed or rhythm of the heartbeat. The monitor is a small device applied to your chest. You can wear one while you do your normal daily activities. While wearing this monitor if you have any symptoms to push the button and record what you felt. Once you have worn this monitor for the period of time provider prescribed (Usually 14 days), you will return the monitor device in the postage paid box. Once it is returned they will download the data collected and provide [place] with a report which the provider will then review and we will call you with those results. Important tips:   1. Avoid showering during the first 24 hours of wearing the monitor. 2. Avoid excessive sweating to help maximize wear time. 3. Do not submerge the device, no hot tubs, and no swimming pools. 4. Keep any lotions or oils away from the patch. 5. After 24 hours you may shower with the patch on. Take brief showers with your back facing the shower head.  6. Do not remove patch once it has been placed because that will interrupt data and decrease adhesive wear time. 7. Push the button when you have any symptoms and write down what you were feeling. 8. Once you have completed wearing your monitor, remove and place into box which has postage paid and place in your outgoing mailbox.  9. If for some reason you have misplaced your box then call our office and we can provide another box and/or mail it off for you.

## 2022-06-30 NOTE — Progress Notes (Addendum)
ANTICOAGULATION CONSULT NOTE - Initial Consult  Pharmacy Consult for Eliquis  Indication: atrial fibrillation  Allergies  Allergen Reactions   Bactrim [Sulfamethoxazole-Trimethoprim] Rash    Rash broke out on stomach   Other Itching and Rash    CSF-HC powder applied to the mastoid cavity results in severe itching and ear drainage.    Patient Measurements: Height: '5\' 9"'$  (175.3 cm) Weight: 91.3 kg (201 lb 4.5 oz) IBW/kg (Calculated) : 66.2  Vital Signs: Temp: 97.5 F (36.4 C) (10/16 1124) Temp Source: Axillary (10/16 1124) BP: 116/67 (10/16 1430) Pulse Rate: 73 (10/16 1430)  Labs: Recent Labs    06/29/22 1524 06/29/22 1714 06/30/22 1105  HGB 17.0*  --  13.4  HCT 50.4*  --  41.2  PLT 365  --  263  CREATININE 0.92  --  0.78  TROPONINIHS 7 5  --     Estimated Creatinine Clearance: 74.2 mL/min (by C-G formula based on SCr of 0.78 mg/dL).   Medical History: Past Medical History:  Diagnosis Date   Deafness in right ear    Diverticulitis    Hyperlipidemia     Assessment: Patient admitted for left sided numbness, neurology consulted for stroke workup. Her symptoms had spontaneous resolution without the need for TNK. Patient also noted to have new onset afib. Pharmacy consulted to dose Eliquis for afib. CHA2DS2-VASc score is 4.   Patient's hemoglobin and renal function as stable. Patient does not qualify to dose reduction.No interacting medications noted.    Plan:  Eliquis '5mg'$  BID to start this afternoon (will give 1x dose before patient discharges) as patient received lovenox this morning.  D/C enoxaparin for DVT ppx.  Monitor for s/sx of bleeding.   Ventura Sellers 06/30/2022,3:12 PM

## 2022-06-30 NOTE — ED Notes (Signed)
Recliner chair given to patient's daughter.

## 2022-06-30 NOTE — ED Notes (Signed)
The patient continues to have frequent sinus pauses lasting only a few seconds. New EKG obtained. Dr. Hurley Cisco informed.

## 2022-06-30 NOTE — Assessment & Plan Note (Addendum)
Occurred yesterday, resolved. ? If she had TIA though in setting of new onset A.Fib. 1. MRI brain ordered 2. 2d echo as below 3. If MRI positive for stroke: 1. Call neurology 2. Needs full stroke work up 3. And likely needs AC started in near future as her CHADS-VASC score goes way up. 4. If MRI neg for stroke: 1. TIA vs not TIA becomes a little less clear, may want to call neurology for opinion anyhow. 2. And obviously chads-vasc becomes a bit less clear 3. L sided arm symptoms secondary to cardiac issue (like A.Fib with severe RVR) also possible, doesn't have to be neurologic. 5. Spoke with Dr. Lorrin Goodell - actually he just wants to see in consult and decide if TIA or not.

## 2022-07-01 ENCOUNTER — Other Ambulatory Visit (HOSPITAL_COMMUNITY): Payer: Self-pay

## 2022-07-01 DIAGNOSIS — I480 Paroxysmal atrial fibrillation: Secondary | ICD-10-CM | POA: Diagnosis not present

## 2022-07-03 DIAGNOSIS — E782 Mixed hyperlipidemia: Secondary | ICD-10-CM | POA: Diagnosis not present

## 2022-07-03 DIAGNOSIS — I480 Paroxysmal atrial fibrillation: Secondary | ICD-10-CM | POA: Diagnosis not present

## 2022-07-03 DIAGNOSIS — R5383 Other fatigue: Secondary | ICD-10-CM | POA: Diagnosis not present

## 2022-07-03 DIAGNOSIS — R410 Disorientation, unspecified: Secondary | ICD-10-CM | POA: Diagnosis not present

## 2022-07-03 DIAGNOSIS — Z09 Encounter for follow-up examination after completed treatment for conditions other than malignant neoplasm: Secondary | ICD-10-CM | POA: Diagnosis not present

## 2022-07-03 DIAGNOSIS — I4821 Permanent atrial fibrillation: Secondary | ICD-10-CM | POA: Diagnosis not present

## 2022-07-03 DIAGNOSIS — R7303 Prediabetes: Secondary | ICD-10-CM | POA: Diagnosis not present

## 2022-07-03 DIAGNOSIS — G459 Transient cerebral ischemic attack, unspecified: Secondary | ICD-10-CM | POA: Diagnosis not present

## 2022-07-04 LAB — CULTURE, BLOOD (ROUTINE X 2)
Culture: NO GROWTH
Culture: NO GROWTH

## 2022-07-14 ENCOUNTER — Other Ambulatory Visit: Payer: Self-pay | Admitting: Internal Medicine

## 2022-07-14 DIAGNOSIS — Z1231 Encounter for screening mammogram for malignant neoplasm of breast: Secondary | ICD-10-CM

## 2022-07-17 DIAGNOSIS — I480 Paroxysmal atrial fibrillation: Secondary | ICD-10-CM | POA: Diagnosis not present

## 2022-07-17 DIAGNOSIS — R7303 Prediabetes: Secondary | ICD-10-CM | POA: Diagnosis not present

## 2022-07-17 DIAGNOSIS — E782 Mixed hyperlipidemia: Secondary | ICD-10-CM | POA: Diagnosis not present

## 2022-07-17 DIAGNOSIS — G459 Transient cerebral ischemic attack, unspecified: Secondary | ICD-10-CM | POA: Diagnosis not present

## 2022-07-22 ENCOUNTER — Encounter: Payer: Self-pay | Admitting: Cardiovascular Disease

## 2022-07-22 ENCOUNTER — Ambulatory Visit: Payer: Medicare Other | Attending: Cardiovascular Disease | Admitting: Cardiovascular Disease

## 2022-07-22 VITALS — BP 120/72 | HR 84 | Ht 69.0 in | Wt 198.2 lb

## 2022-07-22 DIAGNOSIS — E782 Mixed hyperlipidemia: Secondary | ICD-10-CM | POA: Diagnosis not present

## 2022-07-22 DIAGNOSIS — R0609 Other forms of dyspnea: Secondary | ICD-10-CM

## 2022-07-22 DIAGNOSIS — I480 Paroxysmal atrial fibrillation: Secondary | ICD-10-CM

## 2022-07-22 NOTE — Progress Notes (Signed)
07/22/2022 Kelly Burns   1945/11/16  [id number]  Primary Physician Merrilee Seashore, MD Primary Cardiologist: Lorretta Harp MD Kelly Burns, [place]  HPI:  Kelly Burns is a 76 y.o. moderately overweight widowed Caucasian female mother of 70, grandmother of 5 grandchildren who is accompanied by her daughter Kelly Burns today.  She was referred by the hospital for recent episode of PAF.  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  She lives alone and is independent.  She does have mild hyperlipidemia on statin therapy.  She is never had a heart attack or documented stroke although there is a question of TIA.  She was recently hospital hospitalized on 06/29/2022 for 1 day.  She was initially in A-fib with RVR and converted to sinus rhythm.  She was was begun on Eliquis oral anticoagulation.  2D echo was normal and head CT/MRI showed no evidence of stroke.  She does complain of some dyspnea on exertion over the last year but denies chest pain.   Current Meds  Medication Sig   apixaban (ELIQUIS) 5 MG TABS tablet Take 1 tablet (5 mg total) by mouth 2 (two) times daily.   calcium-vitamin D (OSCAL WITH D) 500-5 MG-MCG tablet Take 1 tablet by mouth daily with breakfast.   Multiple Vitamin (MULTIVITAMIN) tablet Take 1 tablet by mouth daily.   PREMARIN vaginal cream Place 1 g vaginally once a week.   Probiotic Product (ALIGN PO) Take 1 capsule by mouth every other day.   rosuvastatin (CRESTOR) 5 MG tablet Take 0.5 tablets (2.5 mg total) by mouth daily.     Allergies  Allergen Reactions   Bactrim [Sulfamethoxazole-Trimethoprim] Rash    Rash broke out on stomach   Other Itching and Rash    CSF-HC powder applied to the mastoid cavity results in severe itching and ear drainage.    Social History   Socioeconomic History   Marital status: Widowed    Spouse name: Not on file   Number of children: 3   Years of education:  Not on file   Highest education level: Some college, no degree  Occupational History   Not on file  Tobacco Use   Smoking status: Former    Types: Cigars   Smokeless tobacco: Never  Vaping Use   Vaping Use: Never used  Substance and Sexual Activity   Alcohol use: Yes    Comment: social   Drug use: No   Sexual activity: Never  Other Topics Concern   Not on file  Social History Narrative   Lives at home alone    Right handed   Caffeine: decaf coffee 2 cups/day   Social Determinants of Health   Financial Resource Strain: Not on file  Food Insecurity: Not on file  Transportation Needs: Not on file  Physical Activity: Not on file  Stress: Not on file  Social Connections: Not on file  Intimate Partner Violence: Not on file     Review of Systems: General: negative for chills, fever, night sweats or weight changes.  Cardiovascular: negative for chest pain, dyspnea on exertion, edema, orthopnea, palpitations, paroxysmal nocturnal dyspnea or shortness of breath Dermatological: negative for rash Respiratory: negative for cough or wheezing Urologic: negative for hematuria Abdominal: negative for nausea, vomiting, diarrhea, bright red blood per rectum, melena, or hematemesis Neurologic: negative for visual changes, syncope, or dizziness All other systems reviewed and are otherwise negative except as  noted above.    Blood pressure 120/72, pulse 84, height '5\' 9"'$  (1.753 m), weight 198 lb 3.2 oz (89.9 kg), SpO2 96 %.  General appearance: alert and no distress Neck: no adenopathy, no carotid bruit, no JVD, supple, symmetrical, trachea midline, and thyroid not enlarged, symmetric, no tenderness/mass/nodules Lungs: clear to auscultation bilaterally Heart: regular rate and rhythm, S1, S2 normal, no murmur, click, rub or gallop Extremities: extremities normal, atraumatic, no cyanosis or edema Pulses: 2+ and symmetric Skin: Skin color, texture, turgor normal. No rashes or  lesions Neurologic: Grossly normal  EKG sinus rhythm at 84 without ST or T wave changes.  Personally reviewed this EKG.  ASSESSMENT AND PLAN:   Hyperlipidemia History of hyperlipidemia on low-dose Crestor followed by her PCP.  PAF (paroxysmal atrial fibrillation) ([place]) History of PAF during recent hospitalization 06/29/2022.  She did complain of some left hand numbness but had negative imaging for stroke or TIA.  She was started on Eliquis oral anticoagulation by the hospitalist. This patients CHA2DS2-VASc Score and unadjusted Ischemic Stroke Rate (% per year) is equal to 4.8 % stroke rate/year from a score of 4.  Above score calculated as 1 point each if present [CHF, HTN, DM, Vascular=MI/PAD/Aortic Plaque, Age if 65-74, or Female] Above score calculated as 2 points each if present [Age > 75, or Stroke/TIA/TE]   An event monitor was ordered which has not yet been resulted.  Dyspnea on exertion Patient complains of some dyspnea on exertion over the last year.  She is never been a smoker.  Her 2D echo was essentially normal during her recent hospitalization.  I am going to get a coronary calcium score to further evaluate.     Lorretta Harp MD FACP,FACC,FAHA, [facility] 07/22/2022 10:18 AM

## 2022-07-22 NOTE — Assessment & Plan Note (Signed)
History of hyperlipidemia on low-dose Crestor followed by her PCP.

## 2022-07-22 NOTE — Assessment & Plan Note (Signed)
Patient complains of some dyspnea on exertion over the last year.  She is never been a smoker.  Her 2D echo was essentially normal during her recent hospitalization.  I am going to get a coronary calcium score to further evaluate.

## 2022-07-22 NOTE — Assessment & Plan Note (Signed)
History of PAF during recent hospitalization 06/29/2022.  She did complain of some left hand numbness but had negative imaging for stroke or TIA.  She was started on Eliquis oral anticoagulation by the hospitalist. This patients CHA2DS2-VASc Score and unadjusted Ischemic Stroke Rate (% per year) is equal to 4.8 % stroke rate/year from a score of 4.  Above score calculated as 1 point each if present [CHF, HTN, DM, Vascular=MI/PAD/Aortic Plaque, Age if 65-74, or Female] Above score calculated as 2 points each if present [Age > 75, or Stroke/TIA/TE]   An event monitor was ordered which has not yet been resulted.

## 2022-07-22 NOTE — Patient Instructions (Signed)
Medication Instructions:  Your physician recommends that you continue on your current medications as directed. Please refer to the Current Medication list given to you today.  *If you need a refill on your cardiac medications before your next appointment, please call your pharmacy*    Testing/Procedures: Dr. Gwenlyn Found has ordered a CT coronary calcium score.   Test location:   [facility] [place], [place] [address] )  This is $99 out of pocket.   Coronary CalciumScan A coronary calcium scan is an imaging test used to look for deposits of calcium and other fatty materials (plaques) in the inner lining of the blood vessels of the heart (coronary arteries). These deposits of calcium and plaques can partly clog and narrow the coronary arteries without producing any symptoms or warning signs. This puts a person at risk for a heart attack. This test can detect these deposits before symptoms develop. Tell a health care provider about: Any allergies you have. All medicines you are taking, including vitamins, herbs, eye drops, creams, and over-the-counter medicines. Any problems you or family members have had with anesthetic medicines. Any blood disorders you have. Any surgeries you have had. Any medical conditions you have. Whether you are pregnant or may be pregnant. What are the risks? Generally, this is a safe procedure. However, problems may occur, including: Harm to a pregnant woman and her unborn baby. This test involves the use of radiation. Radiation exposure can be dangerous to a pregnant woman and her unborn baby. If you are pregnant, you generally should not have this procedure done. Slight increase in the risk of cancer. This is because of the radiation involved in the test. What happens before the procedure? No preparation is needed for this procedure. What happens during the procedure? You will undress and remove any jewelry around your neck or  chest. You will put on a hospital gown. Sticky electrodes will be placed on your chest. The electrodes will be connected to an electrocardiogram (ECG) machine to record a tracing of the electrical activity of your heart. A CT scanner will take pictures of your heart. During this time, you will be asked to lie still and hold your breath for 2-3 seconds while a picture of your heart is being taken. The procedure may vary among health care providers and hospitals. What happens after the procedure? You can get dressed. You can return to your normal activities. It is up to you to get the results of your test. Ask your health care provider, or the department that is doing the test, when your results will be ready. Summary A coronary calcium scan is an imaging test used to look for deposits of calcium and other fatty materials (plaques) in the inner lining of the blood vessels of the heart (coronary arteries). Generally, this is a safe procedure. Tell your health care provider if you are pregnant or may be pregnant. No preparation is needed for this procedure. A CT scanner will take pictures of your heart. You can return to your normal activities after the scan is done. This information is not intended to replace advice given to you by your health care provider. Make sure you discuss any questions you have with your health care provider. Document Released: 02/28/2008 Document Revised: 07/21/2016 Document Reviewed: 07/21/2016 Elsevier Interactive Patient Education  2017 [place]: At [facility], you and your health needs are our priority.  As part of our continuing mission to provide  you with exceptional heart care, we have created designated Provider Care Teams.  These Care Teams include your primary Cardiologist (physician) and Advanced Practice Providers (APPs -  Physician Assistants and Nurse Practitioners) who all work together to provide you with the care you need,  when you need it.  We recommend signing up for the patient portal called "MyChart".  Sign up information is provided on this After Visit Summary.  MyChart is used to connect with patients for Virtual Visits (Telemedicine).  Patients are able to view lab/test results, encounter notes, upcoming appointments, etc.  Non-urgent messages can be sent to your provider as well.   To learn more about what you can do with MyChart, go to [email or web address].    Your next appointment:   6 month(s)  The format for your next appointment:   In Person  Provider:   Quay Burow, MD

## 2022-07-30 ENCOUNTER — Ambulatory Visit: Payer: Medicare Other | Admitting: Neurology

## 2022-07-30 ENCOUNTER — Encounter: Payer: Self-pay | Admitting: Cardiovascular Disease

## 2022-07-30 ENCOUNTER — Encounter: Payer: Self-pay | Admitting: Neurology

## 2022-07-30 VITALS — BP 136/84 | HR 92 | Ht 69.0 in | Wt 199.8 lb

## 2022-07-30 DIAGNOSIS — F439 Reaction to severe stress, unspecified: Secondary | ICD-10-CM

## 2022-07-30 DIAGNOSIS — R29818 Other symptoms and signs involving the nervous system: Secondary | ICD-10-CM | POA: Diagnosis not present

## 2022-07-30 DIAGNOSIS — G250 Essential tremor: Secondary | ICD-10-CM

## 2022-07-30 DIAGNOSIS — G4719 Other hypersomnia: Secondary | ICD-10-CM

## 2022-07-30 DIAGNOSIS — R0683 Snoring: Secondary | ICD-10-CM | POA: Diagnosis not present

## 2022-07-30 DIAGNOSIS — E663 Overweight: Secondary | ICD-10-CM

## 2022-07-30 DIAGNOSIS — I480 Paroxysmal atrial fibrillation: Secondary | ICD-10-CM | POA: Diagnosis not present

## 2022-07-30 DIAGNOSIS — R351 Nocturia: Secondary | ICD-10-CM

## 2022-07-30 DIAGNOSIS — R002 Palpitations: Secondary | ICD-10-CM

## 2022-07-30 DIAGNOSIS — I471 Supraventricular tachycardia, unspecified: Secondary | ICD-10-CM | POA: Diagnosis not present

## 2022-07-30 NOTE — Telephone Encounter (Signed)
Spoke with patient and gave her the note from Dr. Gwenlyn Found: "I did not see any atrial fibrillation.  There were some short runs of SVT and occasional PACs and PVCs.  If she is symptomatic from this we could put her on a low-dose beta-blocker."   Recommended to patient that when she feels palpitations associated with shortness of breath, lightheadedness or dizziness, have someone take her to the ED or call 911. Also, if that occurs, let our clinic know. Dr. Gwenlyn Found may place you on a low-dose beta blocker that can help to control your heart rhythm.    If you have symptoms during hours in the evening/night or weekend, call [phone] and ask for the cardiologist on call.  Patient stated she gets palpitations when she is sitting in a chair or lying in bed. She said when she feels palpitations, she will take a deep breath and release it slowly and the palpitations stop.

## 2022-07-30 NOTE — Patient Instructions (Addendum)
It was nice to see you again and meet your daughter today.  I am glad to hear that you are feeling almost back to baseline.    You had very thorough work-up last month in the hospital including neurological work-up.  The only test I would recommend is a sleep study to look into the possibility of underlying sleep apnea.  As discussed, if you have sleep apnea, I would like for you to consider CPAP therapy.  We will pick up our discussion after testing.  Your tremor appears stable.

## 2022-07-30 NOTE — Progress Notes (Signed)
Subjective:    Burns ID: Kelly Burns is a 76 y.o. female.  HPI    Interim history:   Kelly Burns is a 76 year old right-handed woman with an underlying medical history of essential tremor, PAF, PSVT, osteoarthritis, diverticulosis, hearing loss, and overweight state, who presents for a new problem visit of possible TIA recently.  Kelly Burns is accompanied by Kelly Burns daughter and 82-monthold granddaughter today.  I had evaluated Kelly Burns in Kelly past for tremors, she was last seen in this clinic on 06/27/2020.  She was stable at Kelly time with regards to Kelly Burns tremor. Of note, she was hospitalized in October with an acute illness.  She was admitted on 06/29/2022 and discharged on 06/30/2022.  She presented with a recent viral respiratory illness and suspected superimposed pneumonia, presented to Kelly emergency room via EMS with nausea, vomiting, diarrhea.  She was on Keflex at Kelly time and reported ongoing cough for weeks.  She was found to have an irregular heart rhythm, had new onset A-fib with RVR.  She converted spontaneously to sinus rhythm, work-up included infectious work-up and TIA work-up as she had an episode of transient numbness a couple of days prior, she was found to have right lower lung opacity on chest x-ray, WBC was elevated at 19.  She was treated with antibiotics for suspected pneumonia.  She was suspected to may have had a TIA a couple of days prior in Kelly context of new onset A-fib.  Work-up included brain MRI without contrast on 06/30/2022.  I reviewed Kelly results: IMPRESSION: 1. No acute infarct or other acute intracranial abnormality. 2. Moderately advanced cerebral white matter signal changes, nonspecific but most commonly due to chronic small vessel disease.   MR angiogram of Kelly head without contrast and MR angiogram of Kelly neck with and without contrast on 06/30/2022 showed:  IMPRESSION: Negative intracranial MRA.  IMPRESSION: Normal for age MRA of Kelly  Neck. Echocardiogram from 06/30/2022 showed an EF of 65 to 70%.  Trivial mitral valve regurgitation was noted, no evidence of mitral stenosis, aortic valve tricuspid, no evidence of aortic valve stenosis was seen.  She was started on Eliquis and a statin.  A1c was in Kelly prediabetes range at 5.8.  Today, 07/30/2022: She reports feeling tired. She has had more fatigue since Kelly Burns acute illness which brought Kelly Burns to Kelly hospital last month.  She has woken up a couple of times with palpitations.  Of note, she snores, she does not wake up rested.  She has nocturia once or maybe twice per average night, denies recurrent nocturnal or morning headaches.  She had a Zio patch which I reviewed in epic, she did not have any evidence of A-fib from what I can see, she had a couple of episodes of SVT.  She had a checkup with Kelly Burns cardiologist and is supposed to go back in 6 months.  She tolerates Kelly Eliquis and Kelly Crestor thus far.  Kelly Burns tremor fluctuates but more or less is stable.  She does endorse stress lately.  She drinks very little caffeine, decaf coffee in Kelly mornings and occasional soda.  She had a tonsillectomy.  She may, has a hearing aid on Kelly left, cholesteatoma affected Kelly hearing on Kelly right.  She goes to bed generally around 9 PM or little after.  Rise time is around 7 AM.  She does not watch TV in Kelly Burns bedroom as she does not have headphones there.  Kelly Burns Epworth sleepiness score is 7 out of  24, fatigue severity score is 25 out of 63.  Kelly Burns's allergies, current medications, family history, past medical history, past social history, past surgical history and problem list were reviewed and updated as appropriate.    Previously:   06/27/2020: She reports feeling stable, no significant exacerbation of Kelly Burns tremor.  She has noticed that Kelly Burns tremor is more noticeable if she has not slept well.  She has had some difficulty sleeping, sometimes she takes 1/2 mg lorazepam.  She has a prescription for it from  Kelly Burns primary care physician, 1 mg strength but does not take a whole pill typically.  She has not had any falls.  She does feel that Kelly Burns balance has not been as good over time.  She recently had a bone density test that was felt to be at higher risk for fracture, she has increased Kelly Burns calcium intake.  She also takes vitamin D.  She tries to exercise.  She wants to lose some weight.  She tries to hydrate well with water and limits Kelly Burns caffeine intake, typically drinks decaf coffee.     I saw Kelly Burns on 04/18/2019, at which time she felt Kelly Burns head tremor was stable.  She had no signs of parkinsonism.  She was advised to follow-up routinely for recheck in 6 months, I did not suggest that she start any symptomatic treatment for Kelly Burns tremor.  We did talk about potential symptomatic treatment options such as a beta-blocker or Mysoline down Kelly road.      I first met Kelly Burns in virtual visit on 12/21/2018 at Kelly request of Kelly Burns primary care physician, at which time she reported a several month history of intermittent head tremor.  On examination she had a mild postural hand tremor, no obvious head tremor, no obvious signs of parkinsonism.  She was advised to follow-up for reevaluation, she was taking Ativan 1 mg strength as needed, mostly for sleep.   12/21/2018: I am conducting a virtual, video based new Burns visit via Webex in lieu of a face-to-face visit for evaluation of Kelly Burns head tremor. Kelly Burns is unaccompanied today and joins via cell phone and in Kelly beginning of Kelly visit Kelly Burns daughter was there to assist as well. She is referred by Kelly Burns primary care physician, Dr. Merrilee Seashore, and I reviewed his office note from 10/05/2018. She had blood work at Kelly time, and I reviewed Kelly results: CBC with differential was unremarkable, other tests included magnesium, TSH and CMP, we will request those results as well.  Kelly Burns reports a several month history of intermittent head tremor, Kelly Burns daughter has noticed it for at  least a year, particularly when Burns watches TV or is at church. It does not particularly bother Kelly Burns but she has had some days where Kelly Burns neck muscles are sore and tired feeling. She takes care of Kelly Burns 72-year-old grandson at home and does not often carry him but sometimes she just gets exhausted by Kelly end of Kelly day. She has noticed it perhaps and mildly in Kelly Burns hands, not a significant issue at this time. She has noticed that some days are better than others and particularly when she has slept well she has noticed a exacerbation of Kelly Burns tremor and stress is a trigger as well. She does not drink a lot of caffeine, typically decaf coffee 2 cups in Kelly morning, some milk, some orange juice and estimates that she drinks about 2-3 cups of water per day. Daughter feels that she could do a little  better with Kelly water intake. She drinks occasional alcohol, not daily and has not noticed any correlation with alcohol consumption and tremor control. She has 2 biological children, son is 66 years old and has a mild hand tremor, was told he has essential tremor. Kelly Burns daughter is 33 and has no tremor issues. She has a total of 3 children. She remembers that one of Kelly Burns paternal aunts had a fairly significant head tremor later in Kelly Burns life. Kelly Burns father was Kelly oldest of 87 siblings and none of Kelly other aunts and uncles or Kelly Burns dad had a tremor. Father lived to be 58 and mother lived to be 27. She is deaf in Kelly Burns ear from a right-sided cholesteatoma for which she had 3 surgeries several years ago, she has a hearing aid in Kelly left ear. she is widowed and lives alone for Kelly past nearly 6 years.       Kelly Burns Past Medical History Is Significant For: Past Medical History:  Diagnosis Date   Deafness in right ear    Diverticulitis    Hyperlipidemia     Kelly Burns Past Surgical History Is Significant For: Past Surgical History:  Procedure Laterality Date   APPENDECTOMY     BREAST BIOPSY Right    CHOLECYSTECTOMY     MIDDLE EAR  SURGERY     TONSILLECTOMY      Kelly Burns Family History Is Significant For: Family History  Problem Relation Age of Onset   Heart disease Mother    Dementia Father    Cancer Maternal Grandmother        breast   Breast cancer Maternal Grandmother    Cancer Maternal Aunt        colon   Breast cancer Cousin     Kelly Burns Social History Is Significant For: Social History   Socioeconomic History   Marital status: Widowed    Spouse name: Not on file   Number of children: 3   Years of education: Not on file   Highest education level: Some college, no degree  Occupational History   Not on file  Tobacco Use   Smoking status: Former    Types: Cigarettes    Quit date: 09/15/2002    Years since quitting: 19.8   Smokeless tobacco: Never  Vaping Use   Vaping Use: Never used  Substance and Sexual Activity   Alcohol use: Yes    Comment: social   Drug use: No   Sexual activity: Never  Other Topics Concern   Not on file  Social History Narrative   Lives at home alone    Right handed   Caffeine: decaf coffee 0-2 cups/day   Social Determinants of Health   Financial Resource Strain: Not on file  Food Insecurity: Not on file  Transportation Needs: Not on file  Physical Activity: Not on file  Stress: Not on file  Social Connections: Not on file    Kelly Burns Allergies Are:  Allergies  Allergen Reactions   Bactrim [Sulfamethoxazole-Trimethoprim] Rash    Rash broke out on stomach   Other Itching and Rash    CSF-HC powder applied to Kelly mastoid cavity results in severe itching and ear drainage.  :   Kelly Burns Current Medications Are:  Outpatient Encounter Medications as of 07/30/2022  Medication Sig   apixaban (ELIQUIS) 5 MG TABS tablet Take 1 tablet (5 mg total) by mouth 2 (two) times daily.   calcium-vitamin D (OSCAL WITH D) 500-5 MG-MCG tablet Take 1 tablet by mouth daily with breakfast.  Multiple Vitamin (MULTIVITAMIN) tablet Take 1 tablet by mouth daily.   PREMARIN vaginal cream Place 1 g  vaginally once a week.   Probiotic Product (ALIGN PO) Take 1 capsule by mouth every other day.   rosuvastatin (CRESTOR) 5 MG tablet Take 0.5 tablets (2.5 mg total) by mouth daily.   No facility-administered encounter medications on file as of 07/30/2022.  :  Review of Systems:  Out of a complete 14 point review of systems, all are reviewed and negative with Kelly exception of these symptoms as listed below:  Review of Systems  Neurological:        Seeing for possible TIA, dizziness, L arm tignling/numbness, fatigue, vomiting, mental fog.  Seen in ED/ and Cardiology ellquis.  No therapy.  Awaiting cardiac monitor results.    Objective:  Neurological Exam  Physical Exam Physical Examination:   Vitals:   07/30/22 0826  BP: 136/84  Pulse: 92    General Examination: Kelly Burns is a very pleasant 76 y.o. female in no acute distress. She appears well-developed and well-nourished and well groomed.   HEENT: Normocephalic, atraumatic, pupils are equal, round and reactive to light and accommodation. Extraocular tracking is well preserved.  Neck is supple.  Face is symmetric with normal facial animation, no hypophonia, slight intermittent voice tremor, mild intermittent side-to-side head tremor.  Slight intermittent tremor.  Hearing is impaired on Kelly right, left hearing aid noted.  Mild mouth dryness, tongue protrudes centrally and palate elevates symmetrically, no carotid bruits.   Chest: Clear to auscultation without wheezing, rhonchi or crackles noted.   Heart: S1+S2+0, regular and normal without murmurs, rubs or gallops noted.    Abdomen: Soft, non-tender and non-distended with normal bowel sounds appreciated on auscultation.   Extremities: There is no pitting edema in Kelly distal lower extremities bilaterally, left distal leg and ankle more puffy than right.  Bilateral compression socks up to Kelly knees.   Skin: Warm and dry without trophic changes noted.   Musculoskeletal: exam  reveals no obvious joint deformities.    Neurologically:  Mental status: Kelly Burns is awake, alert and oriented in all 4 spheres. Kelly Burns immediate and remote memory, attention, language skills and fund of knowledge are appropriate. There is no evidence of aphasia, agnosia, apraxia or anomia. Speech is clear with normal prosody and enunciation. Thought process is linear. Mood is normal and affect is normal.  Cranial nerves II - XII are as described above under HEENT exam.   Motor exam: Normal bulk, strength and tone is noted. There is no drift, or resting tremor. Reflexes are 1+ throughout.  Toes are downgoing. She has a slight left more than right hand postural tremor, no significant action tremor, no intention tremor.    Fine motor skills and coordination: intact and stable.  Cerebellar testing: No dysmetria or intention tremor. There is no truncal or gait ataxia.  Sensory exam: intact to light touch.  Gait, station and balance: She stands easily. No veering to one side is noted. No leaning to one side is noted. Posture is age-appropriate and stance is narrow based. Gait shows normal stride length and normal pace. No problems turning are noted.   Assessment and Plan:  In summary, Kelly Burns is a 76 year old right-handed woman with an underlying medical history of essential tremor, PAF, PSVT, osteoarthritis, diverticulosis, hearing loss, and overweight state, who presents for a new problem visit of possible TIA recently. she had transient neurological symptoms when she presented with acute illness about a month  ago.  She had a prolonged cough, nausea, vomiting, diarrhea, was found to be in new onset A-fib with RVR, work-up was extensive in Kelly hospital and since then she has completed Kelly Burns Zio patch monitor.  This did not confirm a atrial fibrillation but she had SVT, few episodes.  We talked about Kelly work-up.  We talked about secondary prevention quite a bit today.  She is advised to continue  with Kelly Burns current medication regimen.  She may be at risk for obstructive sleep apnea and we talked about sleep apnea evaluation, OSA prognosis and treatment options.  She is willing to proceed with sleep testing.  She would be willing to consider positive airway pressure treatment if Kelly need arises.  She is also scheduled for cardiac calcification CT scan.  We will keep Kelly Burns posted as to Kelly Burns sleep test results and to schedule follow-up accordingly.  This was an extended visit of over 45 minutes with extended chart review and record review.   From Kelly essential tremor standpoint, she is stable.  We will continue to monitor.  I answered all their questions today and Kelly Burns and Kelly Burns daughter were in agreement.  I spent 45 minutes in total face-to-face time and in reviewing records during pre-charting, more than 50% of which was spent in counseling and coordination of care, reviewing test results, reviewing medications and treatment regimen and/or in discussing or reviewing Kelly diagnosis of transient neurological symptoms and other diagnoses as listed, Kelly prognosis and treatment options. Pertinent laboratory and imaging test results that were available during this visit with Kelly Burns were reviewed by me and considered in my medical decision making (see chart for details).

## 2022-08-11 ENCOUNTER — Ambulatory Visit: Payer: Medicare Other | Admitting: General Practice

## 2022-08-11 DIAGNOSIS — L814 Other melanin hyperpigmentation: Secondary | ICD-10-CM | POA: Diagnosis not present

## 2022-08-11 DIAGNOSIS — Z86018 Personal history of other benign neoplasm: Secondary | ICD-10-CM | POA: Diagnosis not present

## 2022-08-11 DIAGNOSIS — L578 Other skin changes due to chronic exposure to nonionizing radiation: Secondary | ICD-10-CM | POA: Diagnosis not present

## 2022-08-11 DIAGNOSIS — L820 Inflamed seborrheic keratosis: Secondary | ICD-10-CM | POA: Diagnosis not present

## 2022-08-11 DIAGNOSIS — L309 Dermatitis, unspecified: Secondary | ICD-10-CM | POA: Diagnosis not present

## 2022-08-11 DIAGNOSIS — L821 Other seborrheic keratosis: Secondary | ICD-10-CM | POA: Diagnosis not present

## 2022-08-11 DIAGNOSIS — L851 Acquired keratosis [keratoderma] palmaris et plantaris: Secondary | ICD-10-CM | POA: Diagnosis not present

## 2022-08-12 ENCOUNTER — Ambulatory Visit: Payer: Medicare Other | Admitting: Cardiology

## 2022-09-01 ENCOUNTER — Telehealth: Payer: Self-pay | Admitting: Neurology

## 2022-09-01 NOTE — Telephone Encounter (Signed)
07/31/22 [employer] medicare no auth req   left VM 08/28/22 [place]

## 2022-09-05 ENCOUNTER — Ambulatory Visit (HOSPITAL_BASED_OUTPATIENT_CLINIC_OR_DEPARTMENT_OTHER)
Admission: RE | Admit: 2022-09-05 | Discharge: 2022-09-05 | Disposition: A | Discharge status: Home / Self Care | Payer: Medicare Other | Source: Ambulatory Visit | Attending: Cardiovascular Disease | Admitting: Cardiovascular Disease

## 2022-09-05 DIAGNOSIS — I480 Paroxysmal atrial fibrillation: Secondary | ICD-10-CM | POA: Insufficient documentation

## 2022-09-05 DIAGNOSIS — E782 Mixed hyperlipidemia: Secondary | ICD-10-CM | POA: Insufficient documentation

## 2022-09-11 ENCOUNTER — Ambulatory Visit
Admission: RE | Admit: 2022-09-11 | Discharge: 2022-09-11 | Disposition: A | Discharge status: Home / Self Care | Payer: Medicare Other | Source: Ambulatory Visit | Attending: Internal Medicine | Admitting: Internal Medicine

## 2022-09-11 DIAGNOSIS — Z1231 Encounter for screening mammogram for malignant neoplasm of breast: Secondary | ICD-10-CM | POA: Diagnosis not present

## 2022-10-01 DIAGNOSIS — R7303 Prediabetes: Secondary | ICD-10-CM | POA: Diagnosis not present

## 2022-10-01 DIAGNOSIS — E782 Mixed hyperlipidemia: Secondary | ICD-10-CM | POA: Diagnosis not present

## 2022-10-01 DIAGNOSIS — I480 Paroxysmal atrial fibrillation: Secondary | ICD-10-CM | POA: Diagnosis not present

## 2022-10-01 DIAGNOSIS — G459 Transient cerebral ischemic attack, unspecified: Secondary | ICD-10-CM | POA: Diagnosis not present

## 2022-10-07 ENCOUNTER — Ambulatory Visit (INDEPENDENT_AMBULATORY_CARE_PROVIDER_SITE_OTHER): Payer: Medicare Other | Admitting: Neurology

## 2022-10-07 DIAGNOSIS — G250 Essential tremor: Secondary | ICD-10-CM

## 2022-10-07 DIAGNOSIS — G4719 Other hypersomnia: Secondary | ICD-10-CM

## 2022-10-07 DIAGNOSIS — E663 Overweight: Secondary | ICD-10-CM

## 2022-10-07 DIAGNOSIS — R002 Palpitations: Secondary | ICD-10-CM

## 2022-10-07 DIAGNOSIS — F439 Reaction to severe stress, unspecified: Secondary | ICD-10-CM

## 2022-10-07 DIAGNOSIS — I480 Paroxysmal atrial fibrillation: Secondary | ICD-10-CM

## 2022-10-07 DIAGNOSIS — G4733 Obstructive sleep apnea (adult) (pediatric): Secondary | ICD-10-CM

## 2022-10-07 DIAGNOSIS — G4734 Idiopathic sleep related nonobstructive alveolar hypoventilation: Secondary | ICD-10-CM

## 2022-10-07 DIAGNOSIS — I471 Supraventricular tachycardia, unspecified: Secondary | ICD-10-CM

## 2022-10-07 DIAGNOSIS — R0683 Snoring: Secondary | ICD-10-CM

## 2022-10-07 DIAGNOSIS — R29818 Other symptoms and signs involving the nervous system: Secondary | ICD-10-CM

## 2022-10-07 DIAGNOSIS — R351 Nocturia: Secondary | ICD-10-CM

## 2022-10-08 DIAGNOSIS — R7303 Prediabetes: Secondary | ICD-10-CM | POA: Diagnosis not present

## 2022-10-08 DIAGNOSIS — I480 Paroxysmal atrial fibrillation: Secondary | ICD-10-CM | POA: Diagnosis not present

## 2022-10-08 DIAGNOSIS — G459 Transient cerebral ischemic attack, unspecified: Secondary | ICD-10-CM | POA: Diagnosis not present

## 2022-10-08 DIAGNOSIS — E782 Mixed hyperlipidemia: Secondary | ICD-10-CM | POA: Diagnosis not present

## 2022-10-09 NOTE — Progress Notes (Signed)
See procedure note.

## 2022-10-10 NOTE — Procedures (Signed)
[facility]  HOME SLEEP TEST (Watch PAT) REPORT  STUDY DATE: 10/07/2022  DOB: [birth year]  MRN: [id number]  ORDERING CLINICIAN: Star Age, MD, PhD   REFERRING CLINICIAN: Merrilee Seashore, MD   CLINICAL INFORMATION/HISTORY: 77 year old right-handed woman with an underlying medical history of essential tremor, PAF, PSVT, osteoarthritis, diverticulosis, hearing loss, overweight state, and TIA, who reports snoring and nonrestorative sleep.    Epworth sleepiness score: 7/24.  BMI: 29.7 kg/m  FINDINGS:   Sleep Summary:   Total Recording Time (hours, min): 10 hours, 25 min  Total Sleep Time (hours, min):  8 hours, 44 min  Percent REM (%):    30.1%   Respiratory Indices:   Calculated pAHI (per hour):  28.1/hour         REM pAHI:    37.2/hour       NREM pAHI: 24.5/hour  Central pAHI: 1.7/hour  Oxygen Saturation Statistics:    Oxygen Saturation (%) Mean: 93%   Minimum oxygen saturation (%):                 73%   O2 Saturation Range (%): 73-99%    O2 Saturation (minutes) <=88%: 20.7 min  Pulse Rate Statistics:   Pulse Mean (bpm):    67/min    Pulse Range (55-93/min)   IMPRESSION: Moderate OSA (obstructive sleep apnea)  Nocturnal hypoxemia  RECOMMENDATION:  This home sleep test demonstrates moderate obstructive sleep apnea with a total AHI of 28.1/hour and O2 nadir of 73% with significant time below or at 88% saturation of over 20 minutes for the night, indicating nocturnal hypoxemia.  Snoring was in the mild to moderate range fairly consistently throughout the study, at times in the louder range. Treatment with a positive airway pressure (PAP) device is recommended. The patient will be advised to proceed with an autoPAP titration/trial at home for now. A full night titration study may be considered to optimize treatment settings, monitor proper oxygen saturations and aid with improvement of tolerance and adherence, if needed down the road.  Alternative treatment options may include a dental device through dentistry or orthodontics in selected patients or Inspire (hypoglossal nerve stimulator) in carefully selected patients (meeting inclusion criteria).  Concomitant weight loss is recommended (where clinically appropriate). Please note that untreated obstructive sleep apnea may carry additional perioperative morbidity. Patients with significant obstructive sleep apnea should receive perioperative PAP therapy and the surgeons and particularly the anesthesiologist should be informed of the diagnosis and the severity of the sleep disordered breathing. The patient should be cautioned not to drive, work at heights, or operate dangerous or heavy equipment when tired or sleepy. Review and reiteration of good sleep hygiene measures should be pursued with any patient. Other causes of the patient's symptoms, including circadian rhythm disturbances, an underlying mood disorder, medication effect and/or an underlying medical problem cannot be ruled out based on this test. Clinical correlation is recommended.  The patient and her referring provider will be notified of the test results. The patient will be seen in follow up in sleep clinic at [facility].  I certify that I have reviewed the raw data recording prior to the issuance of this report in accordance with the standards of the American Academy of Sleep Medicine (AASM).  INTERPRETING PHYSICIAN:   Star Age, MD, PhD Medical Director, [facility]) [place], ABPN (Neurology and Sleep)   [facility] [address] [place], [place] [address] [phone]

## 2022-10-10 NOTE — Addendum Note (Signed)
Addended by: Star Age on: 10/10/2022 02:59 PM   Modules accepted: Orders

## 2022-10-14 ENCOUNTER — Telehealth: Payer: Self-pay | Admitting: *Deleted

## 2022-10-14 NOTE — Telephone Encounter (Signed)
-----  Message from Star Age, MD sent at 10/10/2022  2:59 PM EST ----- Patient referred for TIA concern, seen by me on 07/30/2022, patient had a HST on 10/07/2022.    Please call and notify the patient that the recent home sleep test showed obstructive sleep apnea in the moderate range. I recommend treatment in the form of autoPAP, which means, that we don't have to bring her in for a sleep study with CPAP, but will let her start using a so called autoPAP machine at home, which is a CPAP-like machine with self-adjusting pressures. We will send the order to a local DME company (of her choice, or as per insurance requirement). The DME representative will fit her with a mask, educate her on how to use the machine, how to put the mask on, etc. I have placed an order in the chart. Please send the order, talk to patient, send report to referring MD. We will need a FU in sleep clinic for 10 weeks post-PAP set up, please arrange that with me or one of our NPs. Also reinforce the need for compliance with treatment. Thanks,   Star Age, MD, PhD [facility])

## 2022-10-14 NOTE — Telephone Encounter (Signed)
Called pt & LVM with office number asking for call back.  

## 2022-10-15 NOTE — Telephone Encounter (Signed)
Pt called back. Requesting a call back from nurse.  

## 2022-10-16 NOTE — Telephone Encounter (Signed)
I called pt and relayed sleep study results.  Moderate OSA.  Pt is resistant at this time for cpap (claustrophobic) I relayed that not being treated for OSA can increase risk for heart, lung, brain issues and recommended treatment.  I made appt for Dr. Rexene Alberts to address on 10-21-2022 at [place].  Pt verbalized understanding.

## 2022-10-21 ENCOUNTER — Ambulatory Visit: Payer: Medicare Other | Admitting: Neurology

## 2022-11-09 DIAGNOSIS — R062 Wheezing: Secondary | ICD-10-CM | POA: Diagnosis not present

## 2022-11-09 DIAGNOSIS — R509 Fever, unspecified: Secondary | ICD-10-CM | POA: Diagnosis not present

## 2022-11-09 DIAGNOSIS — M791 Myalgia, unspecified site: Secondary | ICD-10-CM | POA: Diagnosis not present

## 2022-11-09 DIAGNOSIS — U071 COVID-19: Secondary | ICD-10-CM | POA: Diagnosis not present

## 2022-12-01 DIAGNOSIS — N390 Urinary tract infection, site not specified: Secondary | ICD-10-CM | POA: Diagnosis not present

## 2022-12-25 DIAGNOSIS — N390 Urinary tract infection, site not specified: Secondary | ICD-10-CM | POA: Diagnosis not present

## 2022-12-31 DIAGNOSIS — I480 Paroxysmal atrial fibrillation: Secondary | ICD-10-CM | POA: Diagnosis not present

## 2022-12-31 DIAGNOSIS — E782 Mixed hyperlipidemia: Secondary | ICD-10-CM | POA: Diagnosis not present

## 2022-12-31 DIAGNOSIS — R7303 Prediabetes: Secondary | ICD-10-CM | POA: Diagnosis not present

## 2022-12-31 DIAGNOSIS — G459 Transient cerebral ischemic attack, unspecified: Secondary | ICD-10-CM | POA: Diagnosis not present

## 2022-12-31 LAB — LAB REPORT - SCANNED
A1c: 6
A1c: 6
EGFR (Non-African Amer.): 77
EGFR: 77

## 2023-01-07 DIAGNOSIS — E782 Mixed hyperlipidemia: Secondary | ICD-10-CM | POA: Diagnosis not present

## 2023-01-07 DIAGNOSIS — M810 Age-related osteoporosis without current pathological fracture: Secondary | ICD-10-CM | POA: Diagnosis not present

## 2023-01-07 DIAGNOSIS — M159 Polyosteoarthritis, unspecified: Secondary | ICD-10-CM | POA: Diagnosis not present

## 2023-01-07 DIAGNOSIS — R5383 Other fatigue: Secondary | ICD-10-CM | POA: Diagnosis not present

## 2023-01-07 DIAGNOSIS — J411 Mucopurulent chronic bronchitis: Secondary | ICD-10-CM | POA: Diagnosis not present

## 2023-01-07 DIAGNOSIS — R059 Cough, unspecified: Secondary | ICD-10-CM | POA: Diagnosis not present

## 2023-01-13 DIAGNOSIS — E782 Mixed hyperlipidemia: Secondary | ICD-10-CM | POA: Diagnosis not present

## 2023-01-13 DIAGNOSIS — G720 Drug-induced myopathy: Secondary | ICD-10-CM | POA: Diagnosis not present

## 2023-01-13 DIAGNOSIS — M810 Age-related osteoporosis without current pathological fracture: Secondary | ICD-10-CM | POA: Diagnosis not present

## 2023-01-13 DIAGNOSIS — I4821 Permanent atrial fibrillation: Secondary | ICD-10-CM | POA: Diagnosis not present

## 2023-01-20 ENCOUNTER — Ambulatory Visit: Payer: Medicare Other | Attending: Cardiovascular Disease | Admitting: Cardiovascular Disease

## 2023-01-20 ENCOUNTER — Encounter: Payer: Self-pay | Admitting: Cardiovascular Disease

## 2023-01-20 VITALS — BP 124/78 | HR 69 | Ht 69.0 in | Wt 195.6 lb

## 2023-01-20 DIAGNOSIS — E782 Mixed hyperlipidemia: Secondary | ICD-10-CM

## 2023-01-20 DIAGNOSIS — I480 Paroxysmal atrial fibrillation: Secondary | ICD-10-CM

## 2023-01-20 NOTE — Patient Instructions (Signed)
Medication Instructions:  Your physician recommends that you continue on your current medications as directed. Please refer to the Current Medication list given to you today.  *If you need a refill on your cardiac medications before your next appointment, please call your pharmacy*   Follow-Up: At [facility], you and your health needs are our priority.  As part of our continuing mission to provide you with exceptional heart care, we have created designated Provider Care Teams.  These Care Teams include your primary Cardiologist (physician) and Advanced Practice Providers (APPs -  Physician Assistants and Nurse Practitioners) who all work together to provide you with the care you need, when you need it.  We recommend signing up for the patient portal called "MyChart".  Sign up information is provided on this After Visit Summary.  MyChart is used to connect with patients for Virtual Visits (Telemedicine).  Patients are able to view lab/test results, encounter notes, upcoming appointments, etc.  Non-urgent messages can be sent to your provider as well.   To learn more about what you can do with MyChart, go to [email or web address].com.    Your next appointment:   12 month(s)  Provider:   Jonathan Berry, MD    

## 2023-01-20 NOTE — Assessment & Plan Note (Addendum)
On statin therapy followed by her PCP.  Her most recent lipid profile performed 06/30/2022 revealed total cholesterol 209 141 and HDL 36.  She is on Crestor 10 mg a day with a lipid profile performed/17/24 revealing total cholesterol 150, LDL 76 and HDL of 47.  This is at goal for primary prevention.

## 2023-01-20 NOTE — Progress Notes (Addendum)
02/10/2023 Kelly Burns   02-08-1946  [id number]  Primary Physician Georgianne Fick, MD Primary Cardiologist: Runell Gess MD Nicholes Calamity, [place]  HPI:  Kelly Burns is a 77 y.o.  moderately overweight widowed Caucasian female mother of 3, grandmother of 5 grandchildren who is accompanied by her daughter Florentina Addison today. She was referred by the hospital for recent episode of PAF.  I last saw her in the office 07/22/2022.  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 She lives alone and is independent. She does have mild hyperlipidemia on statin therapy. She is never had a heart attack or documented stroke although there is a question of TIA. She was recently hospital hospitalized on 06/29/2022 for 1 day. She was initially in A-fib with RVR and converted to sinus rhythm. She was was begun on Eliquis oral anticoagulation. 2D echo was normal and head CT/MRI showed no evidence of stroke. She does complain of some dyspnea on exertion over the last year but denies chest pain.  Since I saw her 6 months ago I did a coronary calcium score performed 09/05/2022 which was low at 3.  A 2-week event monitor showed occasional PACs, PVCs and short runs of SVT but no PAF.  She did have a sleep study which was significant for moderate obstructive sleep apnea but has yet to be fitted for CPAP.   Current Meds  Medication Sig   apixaban (ELIQUIS) 5 MG TABS tablet Take 1 tablet (5 mg total) by mouth 2 (two) times daily.   calcium-vitamin D (OSCAL WITH D) 500-5 MG-MCG tablet Take 1 tablet by mouth daily with breakfast.   Multiple Vitamin (MULTIVITAMIN) tablet Take 1 tablet by mouth daily.   PREMARIN vaginal cream Place 1 g vaginally once a week.   Probiotic Product (ALIGN PO) Take 1 capsule by mouth every other day.   [DISCONTINUED] rosuvastatin (CRESTOR) 5 MG tablet Take 0.5 tablets (2.5 mg total) by mouth daily.     Allergies   Allergen Reactions   Bactrim [Sulfamethoxazole-Trimethoprim] Rash    Rash broke out on stomach   Other Itching and Rash    CSF-HC powder applied to the mastoid cavity results in severe itching and ear drainage.    Social History   Socioeconomic History   Marital status: Widowed    Spouse name: Not on file   Number of children: 3   Years of education: Not on file   Highest education level: Some college, no degree  Occupational History   Not on file  Tobacco Use   Smoking status: Former    Types: Cigarettes    Quit date: 09/15/2002    Years since quitting: 20.4   Smokeless tobacco: Never  Vaping Use   Vaping Use: Never used  Substance and Sexual Activity   Alcohol use: Yes    Comment: social   Drug use: No   Sexual activity: Never  Other Topics Concern   Not on file  Social History Narrative   Lives at home alone    Right handed   Caffeine: decaf coffee 0-2 cups/day   Social Determinants of Health   Financial Resource Strain: Not on file  Food Insecurity: Not on file  Transportation Needs: Not on file  Physical Activity: Not on file  Stress: Not on file  Social Connections: Not on file  Intimate Partner Violence: Not on file     Review of  Systems: General: negative for chills, fever, night sweats or weight changes.  Cardiovascular: negative for chest pain, dyspnea on exertion, edema, orthopnea, palpitations, paroxysmal nocturnal dyspnea or shortness of breath Dermatological: negative for rash Respiratory: negative for cough or wheezing Urologic: negative for hematuria Abdominal: negative for nausea, vomiting, diarrhea, bright red blood per rectum, melena, or hematemesis Neurologic: negative for visual changes, syncope, or dizziness All other systems reviewed and are otherwise negative except as noted above.    Blood pressure 124/78, pulse 69, height 5\' 9"  (1.753 m), weight 195 lb 9.6 oz (88.7 kg), SpO2 96 %.  General appearance: alert and no distress Neck:  no adenopathy, no carotid bruit, no JVD, supple, symmetrical, trachea midline, and thyroid not enlarged, symmetric, no tenderness/mass/nodules Lungs: clear to auscultation bilaterally Heart: regular rate and rhythm, S1, S2 normal, no murmur, click, rub or gallop Extremities: extremities normal, atraumatic, no cyanosis or edema Pulses: 2+ and symmetric Skin: Skin color, texture, turgor normal. No rashes or lesions Neurologic: Grossly normal  EKG sinus rhythm at 69 without ST or T wave changes.  Personally reviewed this EKG.  ASSESSMENT AND PLAN:   Hyperlipidemia On statin therapy followed by her PCP.  Her most recent lipid profile performed 06/30/2022 revealed total cholesterol 209 141 and HDL 36.  She is on Crestor 10 mg a day with a lipid profile performed/17/24 revealing total cholesterol 150, LDL 76 and HDL of 47.  This is at goal for primary prevention.  PAF (paroxysmal atrial fibrillation) (HCC) History of PAF 06/29/2022 for only 1 day.  She converted to sinus rhythm.  There was a question of TIA versus stroke although workup was unrevealing.  A 2-week Zio patch performed subsequently did not show PAF.  She remains on Eliquis oral anticoagulation for stroke prophylaxis.     Runell Gess MD FACP,FACC,FAHA, [facility] 02/10/2023 3:53 PM

## 2023-01-20 NOTE — Assessment & Plan Note (Signed)
History of PAF 06/29/2022 for only 1 day.  She converted to sinus rhythm.  There was a question of TIA versus stroke although workup was unrevealing.  A 2-week Zio patch performed subsequently did not show PAF.  She remains on Eliquis oral anticoagulation for stroke prophylaxis.

## 2023-01-28 DIAGNOSIS — R5383 Other fatigue: Secondary | ICD-10-CM | POA: Diagnosis not present

## 2023-01-28 DIAGNOSIS — R059 Cough, unspecified: Secondary | ICD-10-CM | POA: Diagnosis not present

## 2023-01-28 DIAGNOSIS — J411 Mucopurulent chronic bronchitis: Secondary | ICD-10-CM | POA: Diagnosis not present

## 2023-02-04 ENCOUNTER — Telehealth: Payer: Self-pay

## 2023-02-04 NOTE — Telephone Encounter (Signed)
Called pt to discuss lipid panel done 06/30/22, recently reviewed by [name]. Pt states that more recent labs were done by her PCP. Pt also states that she is currently taking crestor 10mg  daily. Per our records pt is only taking 2.5mg  daily. Will make this change to reflect dosage pt is taking. Will request labs from PCP and have [name] review.

## 2023-02-05 NOTE — Telephone Encounter (Signed)
Labs received from PCP. Will have [name] review and follow up with pt.

## 2023-02-10 NOTE — Telephone Encounter (Signed)
[name] reviewed recent labs done by pt's PCP. No changes at this time. Addendum made to pt's office visit (01/20/23) to reflect more recent lab work.

## 2023-02-12 DIAGNOSIS — H8111 Benign paroxysmal vertigo, right ear: Secondary | ICD-10-CM | POA: Diagnosis not present

## 2023-03-02 ENCOUNTER — Telehealth: Payer: Self-pay | Admitting: Cardiovascular Disease

## 2023-03-02 MED ORDER — APIXABAN 5 MG PO TABS: 5.0000 mg | ORAL_TABLET | Freq: Two times a day (BID) | ORAL | 5 refills | Status: DC

## 2023-03-02 NOTE — Telephone Encounter (Signed)
Prescription refill request for Eliquis received. Indication: PAF Last office visit: 01/20/23  Erlene Quan MD Scr: 0.76 on 12/31/22  Epic Age: 77 Weight: 88.7kg  Based on above findings Eliquis 5mg  twice daily is the appropriate dose.  Refill approved.

## 2023-03-02 NOTE — Telephone Encounter (Signed)
*  STAT* If patient is at the pharmacy, call can be transferred to refill team.   1. Which medications need to be refilled? (please list name of each medication and dose if known)   apixaban (ELIQUIS) 5 MG TABS tablet    2. Which pharmacy/location (including street and city if local pharmacy) is medication to be sent to?[facility] - [place], [place] - [address]   3. Do they need a 30 day or 90 day supply? 30 days    Pt only has two tablets left.

## 2023-03-03 ENCOUNTER — Ambulatory Visit: Payer: Medicare Other | Admitting: Family

## 2023-03-10 ENCOUNTER — Ambulatory Visit (INDEPENDENT_AMBULATORY_CARE_PROVIDER_SITE_OTHER): Payer: Medicare Other | Admitting: Family

## 2023-03-10 ENCOUNTER — Encounter: Payer: Self-pay | Admitting: Family

## 2023-03-10 VITALS — BP 118/62 | HR 98 | Temp 97.7°F | Resp 16 | Ht 69.5 in | Wt 196.0 lb

## 2023-03-10 DIAGNOSIS — G4700 Insomnia, unspecified: Secondary | ICD-10-CM | POA: Diagnosis not present

## 2023-03-10 DIAGNOSIS — N393 Stress incontinence (female) (male): Secondary | ICD-10-CM

## 2023-03-10 DIAGNOSIS — I480 Paroxysmal atrial fibrillation: Secondary | ICD-10-CM

## 2023-03-10 DIAGNOSIS — H7191 Unspecified cholesteatoma, right ear: Secondary | ICD-10-CM | POA: Diagnosis not present

## 2023-03-10 DIAGNOSIS — E782 Mixed hyperlipidemia: Secondary | ICD-10-CM

## 2023-03-10 DIAGNOSIS — R42 Dizziness and giddiness: Secondary | ICD-10-CM

## 2023-03-10 DIAGNOSIS — G459 Transient cerebral ischemic attack, unspecified: Secondary | ICD-10-CM | POA: Diagnosis not present

## 2023-03-10 DIAGNOSIS — J309 Allergic rhinitis, unspecified: Secondary | ICD-10-CM

## 2023-03-10 LAB — LIPID PANEL
Cholesterol: 154 mg/dL (ref 0–200)
HDL: 42.4 mg/dL (ref >39.00)
LDL Cholesterol: 72 mg/dL (ref 0–99)
NonHDL: 111.4
Total CHOL/HDL Ratio: 4
Triglycerides: 195 mg/dL — ABNORMAL HIGH (ref 0.0–149.0)
VLDL: 39 mg/dL (ref 0.0–40.0)

## 2023-03-10 LAB — COMPREHENSIVE METABOLIC PANEL
ALT: 18 U/L (ref 0–35)
AST: 17 U/L (ref 0–37)
Albumin: 4.2 g/dL (ref 3.5–5.2)
Alkaline Phosphatase: 73 U/L (ref 39–117)
BUN: 14 mg/dL (ref 6–23)
CO2: 27 mEq/L (ref 19–32)
Calcium: 10 mg/dL (ref 8.4–10.5)
Chloride: 106 mEq/L (ref 96–112)
Creatinine, Ser: 0.74 mg/dL (ref 0.40–1.20)
GFR: 79.08 mL/min (ref >60.00)
Glucose, Bld: 97 mg/dL (ref 70–99)
Potassium: 4.1 mEq/L (ref 3.5–5.1)
Sodium: 140 mEq/L (ref 135–145)
Total Bilirubin: 0.7 mg/dL (ref 0.2–1.2)
Total Protein: 6.4 g/dL (ref 6.0–8.3)

## 2023-03-10 NOTE — Assessment & Plan Note (Signed)
She believes that this is a result of her right ear issues.  Currently stable.

## 2023-03-10 NOTE — Assessment & Plan Note (Signed)
Reports that she currently taking zyrtec.  Stable.

## 2023-03-10 NOTE — Patient Instructions (Signed)
VISIT SUMMARY:  During your visit, we discussed your history of diverticulitis, high cholesterol, insomnia, hearing loss, vertigo, seasonal allergies, and stress incontinence. You reported a single bout of diverticulitis and now experience urgency and occasional diarrhea, particularly with certain triggers. Your high cholesterol is managed with Crestor. You also reported occasional insomnia, which is currently stable. You have hearing loss in the right ear due to a cholesteatoma and wear a hearing aid in the left ear. You experience vertigo, particularly with certain movements and environments. Your seasonal allergies are managed with Zyrtec and occasional Flonase. Your stress incontinence has improved since bladder sling placement.  YOUR PLAN:  -ATRIAL FIBRILLATION: This is a condition where your heart beats irregularly. You should continue taking Eliquis as prescribed to manage this condition.  -HYPERLIPIDEMIA: This is a condition where you have high levels of fats in your blood. We will check your cholesterol today to ensure your medication, Crestor, is working effectively.  -DIVERTICULITIS: This is a condition where small pouches in your digestive system get inflamed or infected. You should continue your current management strategies. No current symptoms of diverticulitis.  -INSOMNIA: This is a sleep disorder that can make it hard to fall asleep, hard to stay asleep, or cause you to wake up too early and not be able to get back to sleep. You should continue your current management strategies.  -HEARING LOSS: This is a condition where your ability to hear is reduced. You should continue your current management strategies.  -SEASONAL ALLERGIES: These are allergic responses of your body to a given substance, particularly during certain times of the year. You should continue your current management strategies.  -STRESS INCONTINENCE: This is a condition where you may leak urine during physical  activity or exertion. You should continue your current management strategies.  INSTRUCTIONS:  For your general health maintenance, please schedule a physical exam for the end of September or early October. You should update your tetanus vaccine in 2025 at a pharmacy and continue getting annual flu shots. We are considering discontinuing colonoscopies due to your age and current guidelines. Also, you can discontinue Pap smears as per current guidelines.

## 2023-03-10 NOTE — Assessment & Plan Note (Signed)
Lab Results  Component Value Date   CHOL 209 (H) 06/30/2022   HDL 36 (L) 06/30/2022   LDLCALC 141 (H) 06/30/2022   TRIG 160 (H) 06/30/2022   CHOLHDL 5.8 06/30/2022

## 2023-03-10 NOTE — Progress Notes (Signed)
Subjective:     Patient ID: Kelly Burns, female    DOB: Aug 17, 1946, 77 y.o.   MRN: [id number]  Chief Complaint  Patient presents with   New Patient (Initial Visit)    Here to establish care    HPI  Discussed the use of AI scribe software for clinical note transcription with the patient, who gave verbal consent to proceed.  History of Present Illness   Kelly Burns presents today to establish care.  Pmhx is significant for: history of diverticulitis, high cholesterol, insomnia, hearing loss, vertigo, seasonal allergies, and stress incontinence. She reports a single bout of diverticulitis and now experience urgency and occasional diarrhea, particularly with certain triggers.    Hyperlipidemia-She has high cholesterol, managed with Crestor since October, following an episode of AFib.  She reports that she also had a TIA when she was admitted for AF. She denies residual symptoms from the TIA.    Insomnia-She also report occasional insomnia, historically managed with lorazepam as needed. This is now stable. She has hearing loss in the right ear due to a cholesteatoma and wear a hearing aid in the left ear. She experiences vertigo, particularly with certain movements and environments.   Seasonal allergies- She has seasonal allergies, managed with Zyrtec and occasional Flonase. She also has stress incontinence, improved since bladder sling placement. She reports occasional lightheadedness, not associated with vertigo, particularly when changing positions.       Health Maintenance Due  Topic Date Due   COVID-19 Vaccine (1) Never done   Hepatitis C Screening  Never done   Zoster Vaccines- Shingrix (1 of 2) Never done   Pneumonia Vaccine 79+ Years old (2 of 2 - PCV) 05/11/2014   COLON CANCER SCREENING ANNUAL FOBT  05/25/2015    Past Medical History:  Diagnosis Date   Adhesive capsulitis of left shoulder 05/11/2013   Arthritis    Cataract    Community acquired pneumonia of right  lower lobe of lung 06/29/2022   Deafness in right ear    Diverticulitis    one episode   fibroadenoma right breast 05/11/2013   See U/S 03/2013   Hyperlipidemia    IBS (irritable bowel syndrome)    urgency   PAF (paroxysmal atrial fibrillation) (HCC)    TIA (transient ischemic attack)    Vertigo     Past Surgical History:  Procedure Laterality Date   APPENDECTOMY     BLADDER SUSPENSION  2020   BREAST BIOPSY Right    reports that it was a cyst   CHOLECYSTECTOMY     MIDDLE EAR SURGERY     TONSILLECTOMY      Family History  Problem Relation Age of Onset   Heart disease Mother    Dementia Father    Cancer Maternal Grandmother        breast   Breast cancer Maternal Grandmother    Cancer Maternal Aunt        colon   Breast cancer Cousin     Social History   Socioeconomic History   Marital status: Widowed    Spouse name: Not on file   Number of children: 3   Years of education: Not on file   Highest education level: Some college, no degree  Occupational History   Not on file  Tobacco Use   Smoking status: Former    Types: Cigarettes    Quit date: 09/15/2002    Years since quitting: 20.4   Smokeless tobacco: Never  Vaping Use  Vaping Use: Never used  Substance and Sexual Activity   Alcohol use: Yes    Comment: social   Drug use: No   Sexual activity: Not Currently  Other Topics Concern   Not on file  Social History Narrative   Lives at home alone    Widowed (husband died 04/08/2013)   Right handed   Caffeine: decaf coffee 0-2 cups/day   1 adopted son   1 biological son   1 biological daughter   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    Social Determinants of Health   Financial Resource Strain: Not on file  Food Insecurity: Not on file  Transportation Needs: Not on file  Physical Activity: Sufficiently Active (03/10/2023)   Exercise Vital Sign    Days of Exercise per Week: 5 days    Minutes of  Exercise per Session: 40 min  Stress: Not on file  Social Connections: Not on file  Intimate Partner Violence: Not on file    Outpatient Medications Prior to Visit  Medication Sig Dispense Refill   apixaban (ELIQUIS) 5 MG TABS tablet Take 1 tablet (5 mg total) by mouth 2 (two) times daily. 60 tablet 5   calcium-vitamin D (OSCAL WITH D) 500-5 MG-MCG tablet Take 1 tablet by mouth daily with breakfast.     fluticasone (FLONASE ALLERGY RELIEF) 50 MCG/ACT nasal spray 1 spray in each nostril Nasally Once a day     Multiple Vitamin (MULTIVITAMIN) tablet Take 1 tablet by mouth daily.     PREMARIN vaginal cream Place 1 g vaginally once a week.     Probiotic Product (ALIGN PO) Take 1 capsule by mouth every other day.     rosuvastatin (CRESTOR) 10 MG tablet Take 10 mg by mouth daily.     No facility-administered medications prior to visit.    Allergies  Allergen Reactions   Bactrim [Sulfamethoxazole-Trimethoprim] Rash    Rash broke out on stomach   Other Itching and Rash    CSF-HC powder applied to the mastoid cavity results in severe itching and ear drainage.    ROS See HPI    Objective:    Physical Exam Constitutional:      General: She is not in acute distress.    Appearance: Normal appearance. She is well-developed.  HENT:     Head: Normocephalic and atraumatic.     Comments: R TM is absent    Right Ear: Ear canal and external ear normal.     Left Ear: Tympanic membrane, ear canal and external ear normal.  Eyes:     General: No scleral icterus. Neck:     Thyroid: No thyromegaly.  Cardiovascular:     Rate and Rhythm: Normal rate and regular rhythm.     Heart sounds: Normal heart sounds. No murmur heard. Pulmonary:     Effort: Pulmonary effort is normal. No respiratory distress.     Breath sounds: Normal breath sounds. No wheezing.  Musculoskeletal:     Cervical back: Neck supple.  Skin:    General: Skin is warm and dry.  Neurological:     Mental Status: She is alert  and oriented to person, place, and time.  Psychiatric:        Mood and Affect: Mood normal.        Behavior: Behavior normal.        Thought Content: Thought content normal.        Judgment: Judgment normal.  BP 118/62 (BP Location: Right Arm, Patient Position: Sitting, Cuff Size: Large)   Pulse 98   Temp 97.7 F (36.5 C) (Oral)   Resp 16   Ht 5' 9.5" (1.765 m)   Wt 196 lb (88.9 kg)   SpO2 97%   BMI 28.53 kg/m  Wt Readings from Last 3 Encounters:  03/10/23 196 lb (88.9 kg)  01/20/23 195 lb 9.6 oz (88.7 kg)  07/30/22 199 lb 12.8 oz (90.6 kg)       Assessment & Plan:   Problem List Items Addressed This Visit       Unprioritized   Vertigo    She believes that this is a result of her right ear issues.  Currently stable.        TIA (transient ischemic attack)    She continues Eliquis.  LDL goal <70.  Check lipids.        Stress incontinence    Resolved following bladder sling      PAF (paroxysmal atrial fibrillation) (HCC)    Rate stable, maintained on Eliquis, follows with cardiology- [name].      Insomnia    Reports that she had some situational insomnia but currently stable.       Hyperlipidemia - Primary    Lab Results  Component Value Date   CHOL 209 (H) 06/30/2022   HDL 36 (L) 06/30/2022   LDLCALC 141 (H) 06/30/2022   TRIG 160 (H) 06/30/2022   CHOLHDL 5.8 06/30/2022        Relevant Orders   Lipid panel   Comp Met (CMET)   Cholesteatoma of right ear    Has very little hearing in the right ear.  Wears hearing aid in the left ear.       Allergic rhinitis    Reports that she currently taking zyrtec.  Stable.       Will request records from her previous PCP and colonoscopy from [facility].  45 minutes spent on today's visit.  Time was spent reviewing medical record and interviewing/examining patient.   I am having Kelly Burns maintain her Probiotic Product (ALIGN PO), multivitamin, Premarin, calcium-vitamin D, rosuvastatin,  apixaban, and fluticasone.  No orders of the defined types were placed in this encounter.

## 2023-03-10 NOTE — Assessment & Plan Note (Signed)
Has very little hearing in the right ear.  Wears hearing aid in the left ear.

## 2023-03-10 NOTE — Assessment & Plan Note (Signed)
Reports that she had some situational insomnia but currently stable.

## 2023-03-10 NOTE — Assessment & Plan Note (Signed)
Rate stable, maintained on Eliquis, follows with cardiology- [name].

## 2023-03-10 NOTE — Assessment & Plan Note (Signed)
She continues Eliquis.  LDL goal <70.  Check lipids.

## 2023-03-10 NOTE — Assessment & Plan Note (Signed)
Resolved following bladder sling

## 2023-03-31 ENCOUNTER — Telehealth: Payer: Self-pay | Admitting: Family

## 2023-03-31 NOTE — Telephone Encounter (Signed)
Copied from CRM [id number]. Topic: Medicare AWV >> Mar 31, 2023  3:45 PM Payton Doughty wrote: Reason for CRM: Called 03/31/23 to r/s AWV, it was sched too soon due 06/06/23. No voicemail.  Please call to reschedule AWV for 06/06/23 khc   Verlee Rossetti; Care Guide Ambulatory Clinical Support [place] [facility] Direct Dial: [phone]

## 2023-04-01 ENCOUNTER — Ambulatory Visit: Payer: Medicare Other

## 2023-05-13 ENCOUNTER — Ambulatory Visit: Payer: Medicare Other

## 2023-05-15 ENCOUNTER — Encounter: Payer: Self-pay | Admitting: Family

## 2023-05-19 ENCOUNTER — Ambulatory Visit (INDEPENDENT_AMBULATORY_CARE_PROVIDER_SITE_OTHER): Payer: Medicare Other | Admitting: Family

## 2023-05-19 VITALS — BP 123/67 | HR 102 | Temp 98.0°F | Resp 16 | Wt 201.0 lb

## 2023-05-19 DIAGNOSIS — Z7185 Encounter for immunization safety counseling: Secondary | ICD-10-CM

## 2023-05-19 DIAGNOSIS — J069 Acute upper respiratory infection, unspecified: Secondary | ICD-10-CM

## 2023-05-19 MED ORDER — BENZONATATE 100 MG PO CAPS: 100.0000 mg | ORAL_CAPSULE | Freq: Three times a day (TID) | ORAL | 0 refills | Status: DC | PRN

## 2023-05-19 NOTE — Progress Notes (Signed)
Subjective:     Patient ID: Kelly Burns, female    DOB: [birth year], 77 y.o.   MRN: [id number]  Chief Complaint  Patient presents with   Cough    Patient reports persistent cough since Friday   Nasal Congestion    Complains of nasal congestion   Generalized Body Aches    Complains of body aches and headache    HPI  Discussed the use of AI scribe software for clinical note transcription with the patient, who gave verbal consent to proceed.  History of Present Illness              Health Maintenance Due  Topic Date Due   COVID-19 Vaccine (1) Never done   Hepatitis C Screening  Never done   Zoster Vaccines- Shingrix (1 of 2) Never done   Pneumonia Vaccine 39+ Years old (2 of 2 - PCV) 05/11/2014   COLON CANCER SCREENING ANNUAL FOBT  05/25/2015   INFLUENZA VACCINE  04/16/2023   Medicare Annual Wellness (AWV)  06/05/2023    Past Medical History:  Diagnosis Date   Adhesive capsulitis of left shoulder 05/11/2013   Arthritis    Cataract    Community acquired pneumonia of right lower lobe of lung 06/29/2022   Deafness in right ear    Diverticulitis    one episode   fibroadenoma right breast 05/11/2013   See U/S 03/2013   Hyperlipidemia    IBS (irritable bowel syndrome)    urgency   PAF (paroxysmal atrial fibrillation) (HCC)    TIA (transient ischemic attack)    Vertigo     Past Surgical History:  Procedure Laterality Date   APPENDECTOMY     BLADDER SUSPENSION  2019-06-11   BREAST BIOPSY Right    reports that it was a cyst   CHOLECYSTECTOMY     MIDDLE EAR SURGERY     TONSILLECTOMY      Family History  Problem Relation Age of Onset   Heart disease Mother    Dementia Father    Cancer Maternal Grandmother        breast   Breast cancer Maternal Grandmother    Cancer Maternal Aunt        colon   Breast cancer Cousin     Social History   Socioeconomic History   Marital status: Widowed    Spouse name: Not on file   Number of children: 3   Years of  education: Not on file   Highest education level: Some college, no degree  Occupational History   Not on file  Tobacco Use   Smoking status: Former    Current packs/day: 0.00    Types: Cigarettes    Quit date: 09/15/2002    Years since quitting: 20.6   Smokeless tobacco: Never  Vaping Use   Vaping status: Never Used  Substance and Sexual Activity   Alcohol use: Yes    Comment: social   Drug use: No   Sexual activity: Not Currently  Other Topics Concern   Not on file  Social History Narrative   Lives at home alone    Widowed (husband died 06/10/2013)   Right handed   Caffeine: decaf coffee 0-2 cups/day   1 adopted son   1 biological son   1 biological daughter   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    Social Determinants of Health   Financial Resource Strain: Not on file  Food Insecurity: Not  on file  Transportation Needs: Not on file  Physical Activity: Sufficiently Active (03/10/2023)   Exercise Vital Sign    Days of Exercise per Week: 5 days    Minutes of Exercise per Session: 40 min  Stress: Not on file  Social Connections: Unknown (11/09/2022)   Received from [facility]   Social Network    Social Network: Not on file  Intimate Partner Violence: Unknown (11/09/2022)   Received from [facility], [facility]   HITS    Physically Hurt: Not on file    Insult or Talk Down To: Not on file    Threaten Physical Harm: Not on file    Scream or Curse: Not on file    Outpatient Medications Prior to Visit  Medication Sig Dispense Refill   apixaban (ELIQUIS) 5 MG TABS tablet Take 1 tablet (5 mg total) by mouth 2 (two) times daily. 60 tablet 5   calcium-vitamin D (OSCAL WITH D) 500-5 MG-MCG tablet Take 1 tablet by mouth daily with breakfast.     fluticasone (FLONASE ALLERGY RELIEF) 50 MCG/ACT nasal spray 1 spray in each nostril Nasally Once a day     Multiple Vitamin (MULTIVITAMIN) tablet Take 1  tablet by mouth daily.     PREMARIN vaginal cream Place 1 g vaginally once a week.     Probiotic Product (ALIGN PO) Take 1 capsule by mouth every other day.     rosuvastatin (CRESTOR) 10 MG tablet Take 10 mg by mouth daily.     No facility-administered medications prior to visit.    Allergies  Allergen Reactions   Bactrim [Sulfamethoxazole-Trimethoprim] Rash    Rash broke out on stomach   Other Itching and Rash    CSF-HC powder applied to the mastoid cavity results in severe itching and ear drainage.    ROS    See HPI Objective:    Physical Exam Constitutional:      General: She is not in acute distress.    Appearance: Normal appearance. She is well-developed.  HENT:     Head: Normocephalic and atraumatic.     Comments: Voice hoarseness noted, coarse cough    Right Ear: Ear canal and external ear normal.     Left Ear: Tympanic membrane, ear canal and external ear normal.     Ears:     Comments: R TM, large hole noted Eyes:     General: No scleral icterus. Neck:     Thyroid: No thyromegaly.  Cardiovascular:     Rate and Rhythm: Normal rate and regular rhythm.     Heart sounds: Normal heart sounds. No murmur heard. Pulmonary:     Effort: Pulmonary effort is normal. No respiratory distress.     Breath sounds: Normal breath sounds. No wheezing.  Musculoskeletal:     Cervical back: Neck supple.  Skin:    General: Skin is warm and dry.  Neurological:     Mental Status: She is alert and oriented to person, place, and time.  Psychiatric:        Mood and Affect: Mood normal.        Behavior: Behavior normal.        Thought Content: Thought content normal.        Judgment: Judgment normal.      BP 123/67 (BP Location: Right Arm, Patient Position: Sitting, Cuff Size: Large)   Pulse (!) 102   Temp 98 F (36.7 C) (Oral)   Resp 16   Wt 201 lb (91.2  kg)   SpO2 98%   BMI 29.26 kg/m  Wt Readings from Last 3 Encounters:  05/19/23 201 lb (91.2 kg)  03/10/23 196 lb  (88.9 kg)  01/20/23 195 lb 9.6 oz (88.7 kg)       Assessment & Plan:   Problem List Items Addressed This Visit       Unprioritized   Viral URI with cough - Primary     Symptoms started on Friday with cough and fatigue. No signs of pneumonia on examination. History of pneumonia twice last year and bronchitis once. -Prescribe Tessalon pearls for cough. -If not improved by Friday, consider starting antibiotics.       Immunization counseling     Patient has not received flu shot for the current season. -Advise patient to get high dose flu shot when feeling better.   Last recorded pneumonia shot in 2014. Patient reports receiving a more recent shot from previous primary care provider. -Request medical records from previous primary care provider. -Consider pneumonia shot booster when patient is feeling better.  COVID-19 Booster Patient had COVID-19 in February. -Advise patient to consider COVID-19 booster when feeling better.  RSV Vaccination Patient inquired about RSV vaccination. -Advise patient that RSV vaccination can be received at the pharmacy.       I am having Kelly Burns start on benzonatate. I am also having her maintain her Probiotic Product (ALIGN PO), multivitamin, Premarin, calcium-vitamin D, rosuvastatin, apixaban, and fluticasone.  Meds ordered this encounter  Medications   benzonatate (TESSALON) 100 MG capsule    Sig: Take 1 capsule (100 mg total) by mouth 3 (three) times daily as needed.    Dispense:  20 capsule    Refill:  0    Order Specific Question:   Supervising Provider    Answer:   Danise Edge A [[id number]]

## 2023-05-20 DIAGNOSIS — Z0000 Encounter for general adult medical examination without abnormal findings: Secondary | ICD-10-CM | POA: Insufficient documentation

## 2023-05-20 DIAGNOSIS — J069 Acute upper respiratory infection, unspecified: Secondary | ICD-10-CM | POA: Insufficient documentation

## 2023-05-20 DIAGNOSIS — Z7185 Encounter for immunization safety counseling: Secondary | ICD-10-CM | POA: Insufficient documentation

## 2023-05-20 NOTE — Assessment & Plan Note (Addendum)
  Symptoms started on Friday with cough and fatigue. No signs of pneumonia on examination. History of pneumonia twice last year and bronchitis once. -Prescribe Tessalon pearls for cough. -If not improved by Friday, consider starting antibiotics.

## 2023-05-20 NOTE — Assessment & Plan Note (Addendum)
  Patient has not received flu shot for the current season. -Advise patient to get high dose flu shot when feeling better.   Last recorded pneumonia shot in 2014. Patient reports receiving a more recent shot from previous primary care provider. -Request medical records from previous primary care provider. -Consider pneumonia shot booster when patient is feeling better.  COVID-19 Booster Patient had COVID-19 in February. -Advise patient to consider COVID-19 booster when feeling better.  RSV Vaccination Patient inquired about RSV vaccination. -Advise patient that RSV vaccination can be received at the pharmacy.

## 2023-05-20 NOTE — Patient Instructions (Signed)
VISIT SUMMARY:  During your visit, we discussed your current upper respiratory symptoms, which started last Friday. You reported a 'snotty nose' and a 'barking' cough that has progressively worsened, along with a 'tight' sensation in the upper chest and fatigue. We also discussed your medication, Eliquis, and your vaccination history, including pneumonia, flu, COVID-19, and RSV vaccinations.  YOUR PLAN:  -UPPER RESPIRATORY INFECTION: You have symptoms of an upper respiratory infection, which is an infection of the airways in your lungs. I have prescribed Tessalon pearls to help with your cough. If your symptoms do not improve by Friday, we may consider starting antibiotics.  Everlene Balls REFILL: Eliquis is a medication you take to prevent blood clots. You were unsure about the remaining refills for this medication. Please check your medication bottle or call your pharmacy to find out. I am ready to send a refill when needed.  -PNEUMONIA VACCINATION: The pneumonia vaccine helps protect against pneumonia, a lung infection that can be serious. Your last recorded pneumonia shot was in 2014, but you reported receiving a more recent shot from your previous primary care provider. We will request your medical records from them. We may consider a pneumonia shot booster when you are feeling better.  -FLU VACCINATION: The flu vaccine helps protect against the flu, a common and potentially serious illness. You have not received a flu shot for the current season. I advise you to get a high dose flu shot when you are feeling better.  -COVID-19 BOOSTER: The COVID-19 booster helps enhance protection against COVID-19, a viral illness. You had COVID-19 in February. I advise you to consider a COVID-19 booster when you are feeling better.  -RSV VACCINATION: The RSV vaccine helps protect against respiratory syncytial virus, a common cause of respiratory infections. You inquired about this vaccination. I advise you that you  can receive the RSV vaccination at the pharmacy.  INSTRUCTIONS:  Please monitor your symptoms closely. If they do not improve by Friday, contact the office. Also, check your Eliquis medication bottle or call your pharmacy to find out about remaining refills. When you are feeling better, consider getting your flu shot, pneumonia shot booster, COVID-19 booster, and RSV vaccination. You can get the RSV vaccination at the pharmacy.

## 2023-05-22 ENCOUNTER — Encounter: Payer: Self-pay | Admitting: Family

## 2023-05-22 MED ORDER — PSEUDOEPH-BROMPHEN-DM 30-2-10 MG/5ML PO SYRP: 5.0000 mL | ORAL_SOLUTION | Freq: Four times a day (QID) | ORAL | 0 refills | Status: DC | PRN

## 2023-05-22 MED ORDER — AMOXICILLIN-POT CLAVULANATE 875-125 MG PO TABS: 1.0000 | ORAL_TABLET | Freq: Two times a day (BID) | ORAL | 0 refills | Status: DC

## 2023-06-09 ENCOUNTER — Encounter: Payer: Self-pay | Admitting: Cardiovascular Disease

## 2023-06-09 ENCOUNTER — Ambulatory Visit (INDEPENDENT_AMBULATORY_CARE_PROVIDER_SITE_OTHER): Payer: Medicare Other | Admitting: *Deleted

## 2023-06-09 DIAGNOSIS — Z0000 Encounter for general adult medical examination without abnormal findings: Secondary | ICD-10-CM | POA: Diagnosis not present

## 2023-06-09 NOTE — Patient Instructions (Signed)
[name] , Thank you for taking time to come for your Medicare Wellness Visit. I appreciate your ongoing commitment to your health goals. Please review the following plan we discussed and let me know if I can assist you in the future.     This is a list of the screening recommended for you and due dates:  Health Maintenance  Topic Date Due   COVID-19 Vaccine (1) Never done   Hepatitis C Screening  Never done   Zoster (Shingles) Vaccine (1 of 2) Never done   Pneumonia Vaccine (2 of 2 - PCV) 05/11/2014   Stool Blood Test  05/25/2015   DTaP/Tdap/Td vaccine (2 - Td or Tdap) 05/24/2024   Medicare Annual Wellness Visit  06/08/2024   Colon Cancer Screening  10/13/2026   Flu Shot  Completed   DEXA scan (bone density measurement)  Completed   HPV Vaccine  Aged Out    Next appointment: Follow up in one year for your annual wellness visit.   Preventive Care 28 Years and Older, Female Preventive care refers to lifestyle choices and visits with your health care provider that can promote health and wellness. What does preventive care include? A yearly physical exam. This is also called an annual well check. Dental exams once or twice a year. Routine eye exams. Ask your health care provider how often you should have your eyes checked. Personal lifestyle choices, including: Daily care of your teeth and gums. Regular physical activity. Eating a healthy diet. Avoiding tobacco and drug use. Limiting alcohol use. Practicing safe sex. Taking low-dose aspirin every day. Taking vitamin and mineral supplements as recommended by your health care provider. What happens during an annual well check? The services and screenings done by your health care provider during your annual well check will depend on your age, overall health, lifestyle risk factors, and family history of disease. Counseling  Your health care provider may ask you questions about your: Alcohol use. Tobacco use. Drug  use. Emotional well-being. Home and relationship well-being. Sexual activity. Eating habits. History of falls. Memory and ability to understand (cognition). Work and work Astronomer. Reproductive health. Screening  You may have the following tests or measurements: Height, weight, and BMI. Blood pressure. Lipid and cholesterol levels. These may be checked every 5 years, or more frequently if you are over 37 years old. Skin check. Lung cancer screening. You may have this screening every year starting at age 36 if you have a 30-pack-year history of smoking and currently smoke or have quit within the past 15 years. Fecal occult blood test (FOBT) of the stool. You may have this test every year starting at age 76. Flexible sigmoidoscopy or colonoscopy. You may have a sigmoidoscopy every 5 years or a colonoscopy every 10 years starting at age 76. Hepatitis C blood test. Hepatitis B blood test. Sexually transmitted disease (STD) testing. Diabetes screening. This is done by checking your blood sugar (glucose) after you have not eaten for a while (fasting). You may have this done every 1-3 years. Bone density scan. This is done to screen for osteoporosis. You may have this done starting at age 76. Mammogram. This may be done every 1-2 years. Talk to your health care provider about how often you should have regular mammograms. Talk with your health care provider about your test results, treatment options, and if necessary, the need for more tests. Vaccines  Your health care provider may recommend certain vaccines, such as: Influenza vaccine. This is recommended every year.  Tetanus, diphtheria, and acellular pertussis (Tdap, Td) vaccine. You may need a Td booster every 10 years. Zoster vaccine. You may need this after age 76. Pneumococcal 13-valent conjugate (PCV13) vaccine. One dose is recommended after age 76. Pneumococcal polysaccharide (PPSV23) vaccine. One dose is recommended after age  76. Talk to your health care provider about which screenings and vaccines you need and how often you need them. This information is not intended to replace advice given to you by your health care provider. Make sure you discuss any questions you have with your health care provider. Document Released: 09/28/2015 Document Revised: 05/21/2016 Document Reviewed: 07/03/2015 Elsevier Interactive Patient Education  2017 [employer].  Fall Prevention in the Home Falls can cause injuries. They can happen to people of all ages. There are many things you can do to make your home safe and to help prevent falls. What can I do on the outside of my home? Regularly fix the edges of walkways and driveways and fix any cracks. Remove anything that might make you trip as you walk through a door, such as a raised step or threshold. Trim any bushes or trees on the path to your home. Use bright outdoor lighting. Clear any walking paths of anything that might make someone trip, such as rocks or tools. Regularly check to see if handrails are loose or broken. Make sure that both sides of any steps have handrails. Any raised decks and porches should have guardrails on the edges. Have any leaves, snow, or ice cleared regularly. Use sand or salt on walking paths during winter. Clean up any spills in your garage right away. This includes oil or grease spills. What can I do in the bathroom? Use night lights. Install grab bars by the toilet and in the tub and shower. Do not use towel bars as grab bars. Use non-skid mats or decals in the tub or shower. If you need to sit down in the shower, use a plastic, non-slip stool. Keep the floor dry. Clean up any water that spills on the floor as soon as it happens. Remove soap buildup in the tub or shower regularly. Attach bath mats securely with double-sided non-slip rug tape. Do not have throw rugs and other things on the floor that can make you trip. What can I do in the  bedroom? Use night lights. Make sure that you have a light by your bed that is easy to reach. Do not use any sheets or blankets that are too big for your bed. They should not hang down onto the floor. Have a firm chair that has side arms. You can use this for support while you get dressed. Do not have throw rugs and other things on the floor that can make you trip. What can I do in the kitchen? Clean up any spills right away. Avoid walking on wet floors. Keep items that you use a lot in easy-to-reach places. If you need to reach something above you, use a strong step stool that has a grab bar. Keep electrical cords out of the way. Do not use floor polish or wax that makes floors slippery. If you must use wax, use non-skid floor wax. Do not have throw rugs and other things on the floor that can make you trip. What can I do with my stairs? Do not leave any items on the stairs. Make sure that there are handrails on both sides of the stairs and use them. Fix handrails that are broken or loose.  Make sure that handrails are as long as the stairways. Check any carpeting to make sure that it is firmly attached to the stairs. Fix any carpet that is loose or worn. Avoid having throw rugs at the top or bottom of the stairs. If you do have throw rugs, attach them to the floor with carpet tape. Make sure that you have a light switch at the top of the stairs and the bottom of the stairs. If you do not have them, ask someone to add them for you. What else can I do to help prevent falls? Wear shoes that: Do not have high heels. Have rubber bottoms. Are comfortable and fit you well. Are closed at the toe. Do not wear sandals. If you use a stepladder: Make sure that it is fully opened. Do not climb a closed stepladder. Make sure that both sides of the stepladder are locked into place. Ask someone to hold it for you, if possible. Clearly mark and make sure that you can see: Any grab bars or  handrails. First and last steps. Where the edge of each step is. Use tools that help you move around (mobility aids) if they are needed. These include: Canes. Walkers. Scooters. Crutches. Turn on the lights when you go into a dark area. Replace any light bulbs as soon as they burn out. Set up your furniture so you have a clear path. Avoid moving your furniture around. If any of your floors are uneven, fix them. If there are any pets around you, be aware of where they are. Review your medicines with your doctor. Some medicines can make you feel dizzy. This can increase your chance of falling. Ask your doctor what other things that you can do to help prevent falls. This information is not intended to replace advice given to you by your health care provider. Make sure you discuss any questions you have with your health care provider. Document Released: 06/28/2009 Document Revised: 02/07/2016 Document Reviewed: 10/06/2014 Elsevier Interactive Patient Education  2017 [employer].

## 2023-06-09 NOTE — Progress Notes (Signed)
Subjective:   Kelly Burns is a 77 y.o. female who presents for Medicare Annual (Subsequent) preventive examination.  Visit Complete: Virtual  I connected with  Kelly Burns on 06/09/23 by a audio enabled telemedicine application and verified that I am speaking with the correct person using two identifiers.  Patient Location: Home  Provider Location: Office/Clinic  I discussed the limitations of evaluation and management by telemedicine. The patient expressed understanding and agreed to proceed.  Patient Medicare AWV questionnaire was completed by the patient on 05/12/23; I have confirmed that all information answered by patient is correct and no changes since this date.  Cardiac Risk Factors include: advanced age (>66men, >48 women);dyslipidemia     Objective:   Vital Signs: Unable to obtain new vitals due to this being a telehealth visit.       No data to display          Current Medications (verified) Outpatient Encounter Medications as of 06/09/2023  Medication Sig   apixaban (ELIQUIS) 5 MG TABS tablet Take 1 tablet (5 mg total) by mouth 2 (two) times daily.   calcium-vitamin D (OSCAL WITH D) 500-5 MG-MCG tablet Take 1 tablet by mouth daily with breakfast.   fluticasone (FLONASE ALLERGY RELIEF) 50 MCG/ACT nasal spray 1 spray in each nostril Nasally Once a day   Multiple Vitamin (MULTIVITAMIN) tablet Take 1 tablet by mouth daily.   PREMARIN vaginal cream Place 1 g vaginally once a week.   Probiotic Product (ALIGN PO) Take 1 capsule by mouth every other day.   rosuvastatin (CRESTOR) 10 MG tablet Take 10 mg by mouth daily.   [DISCONTINUED] amoxicillin-clavulanate (AUGMENTIN) 875-125 MG tablet Take 1 tablet by mouth 2 (two) times daily.   [DISCONTINUED] benzonatate (TESSALON) 100 MG capsule Take 1 capsule (100 mg total) by mouth 3 (three) times daily as needed.   [DISCONTINUED] brompheniramine-pseudoephedrine-DM 30-2-10 MG/5ML syrup Take 5 mLs by mouth 4 (four) times  daily as needed.   No facility-administered encounter medications on file as of 06/09/2023.    Allergies (verified) Bactrim [sulfamethoxazole-trimethoprim] and Other   History: Past Medical History:  Diagnosis Date   Adhesive capsulitis of left shoulder 05/11/2013   Arthritis    Cataract    Community acquired pneumonia of right lower lobe of lung 06/29/2022   Deafness in right ear    Diverticulitis    one episode   fibroadenoma right breast 05/11/2013   See U/S 03/2013   Hyperlipidemia    IBS (irritable bowel syndrome)    urgency   PAF (paroxysmal atrial fibrillation) (HCC)    TIA (transient ischemic attack)    Vertigo    Past Surgical History:  Procedure Laterality Date   APPENDECTOMY     BLADDER SUSPENSION  2020   BREAST BIOPSY Right    reports that it was a cyst   CHOLECYSTECTOMY     MIDDLE EAR SURGERY     TONSILLECTOMY     Family History  Problem Relation Age of Onset   Heart disease Mother    Dementia Father    Cancer Maternal Grandmother        breast   Breast cancer Maternal Grandmother    Cancer Maternal Aunt        colon   Breast cancer Cousin    Social History   Socioeconomic History   Marital status: Widowed    Spouse name: Not on file   Number of children: 3   Years of education: Not on file  Highest education level: Some college, no degree  Occupational History   Not on file  Tobacco Use   Smoking status: Former    Current packs/day: 0.00    Types: Cigarettes    Quit date: 09/15/2002    Years since quitting: 20.7   Smokeless tobacco: Never  Vaping Use   Vaping status: Never Used  Substance and Sexual Activity   Alcohol use: Yes    Comment: social   Drug use: No   Sexual activity: Not Currently  Other Topics Concern   Not on file  Social History Narrative   Lives at home alone    Widowed (husband died 07/04/2013)   Right handed   Caffeine: decaf coffee 0-2 cups/day   1 adopted son   1 biological son   1 biological daughter   Retired  Environmental health practitioner for a Nurse, 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    Social Determinants of Health   Financial Resource Strain: Medium Risk (05/12/2023)   Overall Financial Resource Strain (CARDIA)    Difficulty of Paying Living Expenses: Somewhat hard  Food Insecurity: No Food Insecurity (05/12/2023)   Hunger Vital Sign    Worried About Running Out of Food in the Last Year: Never true    Ran Out of Food in the Last Year: Never true  Transportation Needs: No Transportation Needs (05/12/2023)   PRAPARE - Administrator, Civil Service (Medical): No    Lack of Transportation (Non-Medical): No  Physical Activity: Insufficiently Active (05/12/2023)   Exercise Vital Sign    Days of Exercise per Week: 2 days    Minutes of Exercise per Session: 30 min  Stress: Stress Concern Present (05/12/2023)   [employer] of Occupational Health - Occupational Stress Questionnaire    Feeling of Stress : To some extent  Social Connections: Moderately Integrated (05/12/2023)   Social Connection and Isolation Panel [NHANES]    Frequency of Communication with Friends and Family: Three times a week    Frequency of Social Gatherings with Friends and Family: Three times a week    Attends Religious Services: More than 4 times per year    Active Member of Clubs or Organizations: Yes    Attends Banker Meetings: More than 4 times per year    Marital Status: Widowed    Tobacco Counseling Counseling given: Not Answered   Clinical Intake:  Pre-visit preparation completed: Yes  Pain : No/denies pain  Nutritional Risks: None Diabetes: No  How often do you need to have someone help you when you read instructions, pamphlets, or other written materials from your doctor or pharmacy?: 2 - Rarely  Interpreter Needed?: No  Information entered by :: Donne Anon, CMA   Activities of Daily Living    05/12/2023    8:20 AM  In your present state of health, do you  have any difficulty performing the following activities:  Hearing? 1  Vision? 0   1  Difficulty concentrating or making decisions? 0  Walking or climbing stairs? 0  Dressing or bathing? 0  Doing errands, shopping? 0  Preparing Food and eating ? N  Using the Toilet? N  In the past six months, have you accidently leaked urine? Y  Do you have problems with loss of bowel control? Y  Managing your Medications? N  Managing your Finances? N  Housekeeping or managing your Housekeeping? N    Patient Care Team: Sandford Craze, NP as PCP - General (Internal Medicine)  Indicate any recent Medical Services you may have received from other than [facility] providers in the past year (date may be approximate).     Assessment:   This is a routine wellness examination for Kelly Burns.  Hearing/Vision screen No results found.   Goals Addressed   None    Depression Screen    06/09/2023    1:58 PM 03/10/2023    7:59 AM 10/19/2013    2:45 PM 06/27/2013   11:15 AM  PHQ 2/9 Scores  PHQ - 2 Score 0 0 2 3  PHQ- 9 Score  0 5 6    Fall Risk    05/12/2023    8:20 AM 03/10/2023    8:00 AM 04/11/2014    3:50 PM 10/19/2013    2:45 PM  Fall Risk   Falls in the past year? 0 0 No No  Number falls in past yr: 0 0    Injury with Fall? 0 0    Risk for fall due to : No Fall Risks No Fall Risks    Follow up Falls evaluation completed Falls evaluation completed      MEDICARE RISK AT HOME: Medicare Risk at Home Any stairs in or around the home?: No If so, are there any without handrails?: No Home free of loose throw rugs in walkways, pet beds, electrical cords, etc?: No Adequate lighting in your home to reduce risk of falls?: Yes Life alert?: No Use of a cane, walker or w/c?: No Grab bars in the bathroom?: No Shower chair or bench in shower?: No Elevated toilet seat or a handicapped toilet?: No  TIMED UP AND GO:  Was the test performed?  No    Cognitive Function:        06/09/2023    2:12 PM   6CIT Screen  What Year? 0 points  What month? 0 points  What time? 0 points  Count back from 20 0 points  Months in reverse 0 points  Repeat phrase 0 points  Total Score 0 points    Immunizations Immunization History  Administered Date(s) Administered   Influenza Split 07/20/2007, 08/15/2011   Influenza,inj,Quad PF,6+ Mos 05/30/2013, 07/06/2014   Influenza-Unspecified 05/30/2023   Pneumococcal Polysaccharide-23 05/11/2013   Tdap 05/24/2014    TDAP status: Up to date  Flu Vaccine status: Due, Education has been provided regarding the importance of this vaccine. Advised may receive this vaccine at local pharmacy or Health Dept. Aware to provide a copy of the vaccination record if obtained from local pharmacy or Health Dept. Verbalized acceptance and understanding.  Pneumococcal vaccine status: Due, Education has been provided regarding the importance of this vaccine. Advised may receive this vaccine at local pharmacy or Health Dept. Aware to provide a copy of the vaccination record if obtained from local pharmacy or Health Dept. Verbalized acceptance and understanding.  Covid-19 vaccine status: Information provided on how to obtain vaccines.   Qualifies for Shingles Vaccine? Yes   Zostavax completed No   Shingrix Completed?: No.    Education has been provided regarding the importance of this vaccine. Patient has been advised to call insurance company to determine out of pocket expense if they have not yet received this vaccine. Advised may also receive vaccine at local pharmacy or Health Dept. Verbalized acceptance and understanding.  Screening Tests Health Maintenance  Topic Date Due   COVID-19 Vaccine (1) Never done   Hepatitis C Screening  Never done   Zoster Vaccines- Shingrix (1 of 2) Never done  Pneumonia Vaccine 22+ Years old (2 of 2 - PCV) 05/11/2014   COLON CANCER SCREENING ANNUAL FOBT  05/25/2015   Medicare Annual Wellness (AWV)  06/05/2023   DTaP/Tdap/Td (2 - Td  or Tdap) 05/24/2024   Colonoscopy  10/13/2026   INFLUENZA VACCINE  Completed   DEXA SCAN  Completed   HPV VACCINES  Aged Out    Health Maintenance  Health Maintenance Due  Topic Date Due   COVID-19 Vaccine (1) Never done   Hepatitis C Screening  Never done   Zoster Vaccines- Shingrix (1 of 2) Never done   Pneumonia Vaccine 33+ Years old (2 of 2 - PCV) 05/11/2014   COLON CANCER SCREENING ANNUAL FOBT  05/25/2015   Medicare Annual Wellness (AWV)  06/05/2023    Colorectal cancer screening: Type of screening: Colonoscopy. Completed 10/13/16. Repeat every 10 years  Mammogram status: Completed 09/11/22. Repeat every year  Bone Density status: pt declines at this time  Lung Cancer Screening: (Low Dose CT Chest recommended if Age 16-80 years, 20 pack-year currently smoking OR have quit w/in 15years.) does not qualify.   Additional Screening:  Hepatitis C Screening: does qualify; Completed N/a  Vision Screening: Recommended annual ophthalmology exams for early detection of glaucoma and other disorders of the eye. Is the patient up to date with their annual eye exam?  No  Who is the provider or what is the name of the office in which the patient attends annual eye exams? [facility] If pt is not established with a provider, would they like to be referred to a provider to establish care? No .   Dental Screening: Recommended annual dental exams for proper oral hygiene  Diabetic Foot Exam: N/a  Community Resource Referral / Chronic Care Management: CRR required this visit?  No   CCM required this visit?  No     Plan:     I have personally reviewed and noted the following in the patient's chart:   Medical and social history Use of alcohol, tobacco or illicit drugs  Current medications and supplements including opioid prescriptions. Patient is not currently taking opioid prescriptions. Functional ability and status Nutritional status Physical activity Advanced directives List of  other physicians Hospitalizations, surgeries, and ER visits in previous 12 months Vitals Screenings to include cognitive, depression, and falls Referrals and appointments  In addition, I have reviewed and discussed with patient certain preventive protocols, quality metrics, and best practice recommendations. A written personalized care plan for preventive services as well as general preventive health recommendations were provided to patient.     Donne Anon, CMA   06/09/2023   After Visit Summary: (MyChart) Due to this being a telephonic visit, the after visit summary with patients personalized plan was offered to patient via MyChart   Nurse Notes: None

## 2023-06-17 ENCOUNTER — Encounter: Payer: Medicare Other | Admitting: Family

## 2023-06-26 DIAGNOSIS — N390 Urinary tract infection, site not specified: Secondary | ICD-10-CM | POA: Diagnosis not present

## 2023-07-03 ENCOUNTER — Ambulatory Visit (INDEPENDENT_AMBULATORY_CARE_PROVIDER_SITE_OTHER): Payer: Medicare Other | Admitting: Family

## 2023-07-03 ENCOUNTER — Encounter: Payer: Self-pay | Admitting: Family

## 2023-07-03 VITALS — BP 120/60 | HR 90 | Temp 98.2°F | Resp 18 | Ht 69.5 in | Wt 203.0 lb

## 2023-07-03 DIAGNOSIS — J309 Allergic rhinitis, unspecified: Secondary | ICD-10-CM

## 2023-07-03 DIAGNOSIS — Z780 Asymptomatic menopausal state: Secondary | ICD-10-CM | POA: Diagnosis not present

## 2023-07-03 DIAGNOSIS — Z23 Encounter for immunization: Secondary | ICD-10-CM | POA: Diagnosis not present

## 2023-07-03 DIAGNOSIS — I480 Paroxysmal atrial fibrillation: Secondary | ICD-10-CM | POA: Diagnosis not present

## 2023-07-03 DIAGNOSIS — L739 Follicular disorder, unspecified: Secondary | ICD-10-CM | POA: Insufficient documentation

## 2023-07-03 DIAGNOSIS — T7840XA Allergy, unspecified, initial encounter: Secondary | ICD-10-CM | POA: Diagnosis not present

## 2023-07-03 DIAGNOSIS — Z1231 Encounter for screening mammogram for malignant neoplasm of breast: Secondary | ICD-10-CM

## 2023-07-03 DIAGNOSIS — N898 Other specified noninflammatory disorders of vagina: Secondary | ICD-10-CM

## 2023-07-03 DIAGNOSIS — Z0000 Encounter for general adult medical examination without abnormal findings: Secondary | ICD-10-CM | POA: Diagnosis not present

## 2023-07-03 DIAGNOSIS — E782 Mixed hyperlipidemia: Secondary | ICD-10-CM

## 2023-07-03 MED ORDER — LORATADINE 10 MG PO TABS: 10.0000 mg | ORAL_TABLET | Freq: Every day | ORAL | 4 refills | Status: DC

## 2023-07-03 MED ORDER — FLUTICASONE PROPIONATE 50 MCG/ACT NA SUSP: 2.0000 | Freq: Every day | NASAL | 5 refills | Status: DC

## 2023-07-03 MED ORDER — MONTELUKAST SODIUM 10 MG PO TABS: 10.0000 mg | ORAL_TABLET | Freq: Every day | ORAL | 1 refills | Status: DC

## 2023-07-03 NOTE — Progress Notes (Signed)
Subjective:     Patient ID: Kelly Burns, female    DOB: Aug 09, 1946, 77 y.o.   MRN: [id number]  Chief Complaint  Patient presents with   Annual Exam    HPI  Discussed the use of AI scribe software for clinical note transcription with the patient, who gave verbal consent to proceed.  History of Present Illness         Patient presents today for complete physical.  Immunizations: Diet:  Wt Readings from Last 3 Encounters:  07/03/23 203 lb (92.1 kg)  05/19/23 201 lb (91.2 kg)  03/10/23 196 lb (88.9 kg)   Exercise: walks  Colonoscopy: up to date Dexa: due Pap Smear: N/a Mammogram:  due 12/28 Vision: due Dental: up to date     Health Maintenance Due  Topic Date Due   Zoster Vaccines- Shingrix (1 of 2) Never done   COLON CANCER SCREENING ANNUAL FOBT  05/25/2015   COVID-19 Vaccine (2 - Pfizer risk series) 07/07/2023    Past Medical History:  Diagnosis Date   Adhesive capsulitis of left shoulder 05/11/2013   Arthritis    Cataract    Community acquired pneumonia of right lower lobe of lung 06/29/2022   Deafness in right ear    Diverticulitis    one episode   fibroadenoma right breast 05/11/2013   See U/S 03/2013   Hyperlipidemia    IBS (irritable bowel syndrome)    urgency   PAF (paroxysmal atrial fibrillation) (HCC)    TIA (transient ischemic attack)    Vertigo     Past Surgical History:  Procedure Laterality Date   APPENDECTOMY     BLADDER SUSPENSION  July 31, 2019   BREAST BIOPSY Right    reports that it was a cyst   CHOLECYSTECTOMY     MIDDLE EAR SURGERY     TONSILLECTOMY      Family History  Problem Relation Age of Onset   Heart disease Mother    Dementia Father    Cancer Maternal Grandmother        breast   Breast cancer Maternal Grandmother    Cancer Maternal Aunt        colon   Breast cancer Cousin     Social History   Socioeconomic History   Marital status: Widowed    Spouse name: Not on file   Number of children: 3   Years of  education: Not on file   Highest education level: Some college, no degree  Occupational History   Not on file  Tobacco Use   Smoking status: Former    Current packs/day: 0.00    Types: Cigarettes    Quit date: 09/15/2002    Years since quitting: 20.8   Smokeless tobacco: Never  Vaping Use   Vaping status: Never Used  Substance and Sexual Activity   Alcohol use: Yes    Comment: social   Drug use: No   Sexual activity: Not Currently  Other Topics Concern   Not on file  Social History Narrative   Lives at home alone    Widowed (husband died 2013/07/30)   Right handed   Caffeine: decaf coffee 0-2 cups/day   1 adopted son   1 biological son   1 biological daughter   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    Social Determinants of Health   Financial Resource Strain: Medium Risk (05/12/2023)   Overall Financial Resource Strain (CARDIA)    Difficulty of  Paying Living Expenses: Somewhat hard  Food Insecurity: No Food Insecurity (05/12/2023)   Hunger Vital Sign    Worried About Running Out of Food in the Last Year: Never true    Ran Out of Food in the Last Year: Never true  Transportation Needs: No Transportation Needs (05/12/2023)   PRAPARE - Administrator, Civil Service (Medical): No    Lack of Transportation (Non-Medical): No  Physical Activity: Insufficiently Active (05/12/2023)   Exercise Vital Sign    Days of Exercise per Week: 2 days    Minutes of Exercise per Session: 30 min  Stress: Stress Concern Present (05/12/2023)   [employer] of Occupational Health - Occupational Stress Questionnaire    Feeling of Stress : To some extent  Social Connections: Moderately Integrated (05/12/2023)   Social Connection and Isolation Panel [NHANES]    Frequency of Communication with Friends and Family: Three times a week    Frequency of Social Gatherings with Friends and Family: Three times a week    Attends Religious  Services: More than 4 times per year    Active Member of Clubs or Organizations: Yes    Attends Banker Meetings: More than 4 times per year    Marital Status: Widowed  Intimate Partner Violence: Unknown (11/09/2022)   Received from [facility]   HITS    Physically Hurt: Not on file    Insult or Talk Down To: Not on file    Threaten Physical Harm: Not on file    Scream or Curse: Not on file    Outpatient Medications Prior to Visit  Medication Sig Dispense Refill   apixaban (ELIQUIS) 5 MG TABS tablet Take 1 tablet (5 mg total) by mouth 2 (two) times daily. 60 tablet 5   calcium-vitamin D (OSCAL WITH D) 500-5 MG-MCG tablet Take 1 tablet by mouth daily with breakfast.     Multiple Vitamin (MULTIVITAMIN) tablet Take 1 tablet by mouth daily.     PREMARIN vaginal cream Place 1 g vaginally once a week.     Probiotic Product (ALIGN PO) Take 1 capsule by mouth every other day.     rosuvastatin (CRESTOR) 10 MG tablet Take 10 mg by mouth daily.     fluticasone (FLONASE ALLERGY RELIEF) 50 MCG/ACT nasal spray 1 spray in each nostril Nasally Once a day     No facility-administered medications prior to visit.    Allergies  Allergen Reactions   Bactrim [Sulfamethoxazole-Trimethoprim] Rash    Rash broke out on stomach   Other Itching and Rash    CSF-HC powder applied to the mastoid cavity results in severe itching and ear drainage.    Review of Systems  Constitutional:  Negative for weight loss.  HENT:  Positive for hearing loss. Negative for congestion.   Respiratory:  Positive for cough (chronic which she feels is secondary to post nasal drip).   Cardiovascular:  Negative for leg swelling.  Gastrointestinal:  Negative for constipation and diarrhea.  Genitourinary:  Negative for dysuria and frequency.  Musculoskeletal:        Arthritis in her hands  Skin:        Notes cyst right groin  Neurological:  Negative for headaches.  Psychiatric/Behavioral:          Denies anxiety or depession       Objective:    Physical Exam   BP 120/60   Pulse 90   Temp 98.2 F (36.8 C)   Resp 18  Ht 5' 9.5" (1.765 m)   Wt 203 lb (92.1 kg)   SpO2 96%   BMI 29.55 kg/m  Wt Readings from Last 3 Encounters:  07/03/23 203 lb (92.1 kg)  05/19/23 201 lb (91.2 kg)  03/10/23 196 lb (88.9 kg)   Physical Exam  Constitutional: She is oriented to person, place, and time. She appears well-developed and well-nourished. No distress.  HENT:  Head: Normocephalic and atraumatic.  Right Ear: perforated TM Left Ear: Tympanic membrane and ear canal normal.  Mouth/Throat: Oropharynx is clear and moist.  Eyes: Pupils are equal, round, and reactive to light. No scleral icterus.  Neck: Normal range of motion. No thyromegaly present.  Cardiovascular: Normal rate and regular rhythm.   No murmur heard. Pulmonary/Chest: Effort normal and breath sounds normal. No respiratory distress. He has no wheezes. She has no rales. She exhibits no tenderness.  Abdominal: Soft. Bowel sounds are normal. She exhibits no distension and no mass. There is no tenderness. There is no rebound and no guarding.  Musculoskeletal: She exhibits no edema.  Lymphadenopathy:    She has no cervical adenopathy.  Neurological: She is alert and oriented to person, place, and time. She has normal patellar reflexes. She exhibits normal muscle tone. Coordination normal.  Skin: Skin is warm and dry. Small folliculitis noted right groin- firm, pea sized no fluctuance, no erythema Psychiatric: She has a normal mood and affect. Her behavior is normal. Judgment and thought content normal.  Breast/pelvic: deferred         Assessment & Plan:       Assessment & Plan:   Problem List Items Addressed This Visit       Unprioritized   Vaginal dryness     Tender nodule in the groin area, likely folliculitis. Improved with warm water and Epsom salt soaks. -Continue warm water and Epsom salt soaks. -Contact  office if inflammation worsens for possible antibiotic treatment.       Preventative health care - Primary     Concerns about weight gain and slowing metabolism. Discussed the role of diet, exercise, and caloric intake. -Count daily caloric intake and aim for a reduction of 3-500 calories per day. -Consider light weight training or yoga to increase muscle mass and metabolism. -Consider consultation with [facility].   General Health Maintenance -Order bone density scan. -Schedule mammogram for after 09/12/2023. -Consider Hepatitis C screening. -Continue regular dental and eye exams. -Follow up in 6 months.      PAF (paroxysmal atrial fibrillation) (HCC)    Followed by cardiology- maintained on eliquis. Rate stable.       Hyperlipidemia    Lab Results  Component Value Date   CHOL 154 03/10/2023   HDL 42.40 03/10/2023   LDLCALC 72 03/10/2023   TRIG 195.0 (H) 03/10/2023   CHOLHDL 4 03/10/2023   LDL at goal. Continue crestor.       Folliculitis     Tender nodule in the groin area, likely folliculitis. Improved with warm water and Epsom salt soaks. -Continue warm water and Epsom salt soaks. -Contact office if inflammation worsens for possible antibiotic treatment.       Allergic rhinitis    Uncontrolled with prn zyrtec and flonase.  -Stop zyrtec -Start Singulair nightly. -Start Claritin daily. -Continue Flonase.      Allergic   Relevant Medications   fluticasone (FLONASE ALLERGY RELIEF) 50 MCG/ACT nasal spray   Other Visit Diagnoses     Postmenopausal estrogen deficiency  Relevant Orders   DG Bone Density   Breast cancer screening by mammogram       Relevant Orders   MM 3D SCREENING MAMMOGRAM BILATERAL BREAST   Need for pneumococcal vaccination       Relevant Orders   Pneumococcal conjugate vaccine 20-valent (Prevnar 20) (Completed)       I have discontinued [place] [place]'s fluticasone. I am also having her start  on loratadine, montelukast, and fluticasone. Additionally, I am having her maintain her Probiotic Product (ALIGN PO), multivitamin, Premarin, calcium-vitamin D, rosuvastatin, and apixaban.  Meds ordered this encounter  Medications   loratadine (CLARITIN) 10 MG tablet    Sig: Take 1 tablet (10 mg total) by mouth daily.    Dispense:  90 tablet    Refill:  4    Order Specific Question:   Supervising Provider    Answer:   Danise Edge A [[id number]]   montelukast (SINGULAIR) 10 MG tablet    Sig: Take 1 tablet (10 mg total) by mouth at bedtime.    Dispense:  90 tablet    Refill:  1    Order Specific Question:   Supervising Provider    Answer:   Danise Edge A [[id number]]   fluticasone (FLONASE ALLERGY RELIEF) 50 MCG/ACT nasal spray    Sig: Place 2 sprays into both nostrils daily.    Dispense:  16 g    Refill:  5    Order Specific Question:   Supervising Provider    Answer:   Danise Edge A [[id number]]

## 2023-07-03 NOTE — Assessment & Plan Note (Signed)
  Tender nodule in the groin area, likely folliculitis. Improved with warm water and Epsom salt soaks. -Continue warm water and Epsom salt soaks. -Contact office if inflammation worsens for possible antibiotic treatment.

## 2023-07-03 NOTE — Assessment & Plan Note (Signed)
Followed by cardiology- maintained on eliquis. Rate stable.

## 2023-07-03 NOTE — Patient Instructions (Signed)
VISIT SUMMARY:  During your recent visit, we discussed several health concerns including weight gain, postnasal drainage causing a cough, a skin issue, vaginal itching, and a previous episode of heart racing. We also discussed your arthritis in the hands.  YOUR PLAN:  -POSTNASAL DRIP: This is a condition where excess mucus builds up in the back of the nose and throat, causing a cough. We will start you on Singulair nightly and Claritin daily, in addition to your current Flonase treatment.  -WEIGHT GAIN: You've been experiencing weight gain, which you attribute to a slowing metabolism. We discussed the importance of diet, exercise, and caloric intake. You should aim to reduce your daily caloric intake by 3-500 calories, consider light weight training or yoga, and possibly consult with [facility].  -VAGINAL DRYNESS: You've been experiencing vaginal itching likely due to dryness. Continue using Aquaphor as needed and consider treatment for a yeast infection if symptoms worsen or change.  -FOLLICULITIS: You have a tender nodule in the groin area, likely folliculitis, which is an infection of the hair follicles. Continue with warm water and Epsom salt soaks and contact the office if the inflammation worsens for possible antibiotic treatment.  INSTRUCTIONS:  For your general health maintenance, we will order a bone density scan and schedule a mammogram for after 09/12/2023. Please consider getting an RSV vaccine at your pharmacy. Continue with your regular dental and eye exams and follow up with [place] in 6 months.

## 2023-07-03 NOTE — Assessment & Plan Note (Signed)
Lab Results  Component Value Date   CHOL 154 03/10/2023   HDL 42.40 03/10/2023   LDLCALC 72 03/10/2023   TRIG 195.0 (H) 03/10/2023   CHOLHDL 4 03/10/2023   LDL at goal. Continue crestor.

## 2023-07-03 NOTE — Assessment & Plan Note (Signed)
Uncontrolled with prn zyrtec and flonase.  -Stop zyrtec -Start Singulair nightly. -Start Claritin daily. -Continue Flonase.

## 2023-07-03 NOTE — Assessment & Plan Note (Addendum)
  Concerns about weight gain and slowing metabolism. Discussed the role of diet, exercise, and caloric intake. -Count daily caloric intake and aim for a reduction of 3-500 calories per day. -Consider light weight training or yoga to increase muscle mass and metabolism. -Consider consultation with [facility].   General Health Maintenance -Order bone density scan. -Schedule mammogram for after 09/12/2023. - prevnar 20 today -Continue regular dental and eye exams. -Follow up in 6 months.

## 2023-07-15 DIAGNOSIS — H26492 Other secondary cataract, left eye: Secondary | ICD-10-CM | POA: Diagnosis not present

## 2023-07-15 DIAGNOSIS — D3132 Benign neoplasm of left choroid: Secondary | ICD-10-CM | POA: Diagnosis not present

## 2023-08-26 DIAGNOSIS — Z86018 Personal history of other benign neoplasm: Secondary | ICD-10-CM | POA: Diagnosis not present

## 2023-08-26 DIAGNOSIS — L851 Acquired keratosis [keratoderma] palmaris et plantaris: Secondary | ICD-10-CM | POA: Diagnosis not present

## 2023-08-26 DIAGNOSIS — L821 Other seborrheic keratosis: Secondary | ICD-10-CM | POA: Diagnosis not present

## 2023-08-26 DIAGNOSIS — L578 Other skin changes due to chronic exposure to nonionizing radiation: Secondary | ICD-10-CM | POA: Diagnosis not present

## 2023-08-26 DIAGNOSIS — L814 Other melanin hyperpigmentation: Secondary | ICD-10-CM | POA: Diagnosis not present

## 2023-09-01 DIAGNOSIS — Z961 Presence of intraocular lens: Secondary | ICD-10-CM | POA: Diagnosis not present

## 2023-09-01 DIAGNOSIS — H26493 Other secondary cataract, bilateral: Secondary | ICD-10-CM | POA: Diagnosis not present

## 2023-09-01 DIAGNOSIS — H40013 Open angle with borderline findings, low risk, bilateral: Secondary | ICD-10-CM | POA: Diagnosis not present

## 2023-09-01 DIAGNOSIS — H26492 Other secondary cataract, left eye: Secondary | ICD-10-CM | POA: Diagnosis not present

## 2023-09-01 DIAGNOSIS — H18413 Arcus senilis, bilateral: Secondary | ICD-10-CM | POA: Diagnosis not present

## 2023-09-16 HISTORY — PX: OTHER SURGICAL HISTORY: SHX169

## 2023-09-23 ENCOUNTER — Ambulatory Visit (HOSPITAL_BASED_OUTPATIENT_CLINIC_OR_DEPARTMENT_OTHER)
Admission: RE | Admit: 2023-09-23 | Discharge: 2023-09-23 | Disposition: A | Discharge status: Home / Self Care | Payer: Medicare Other | Source: Ambulatory Visit | Attending: Family | Admitting: Family

## 2023-09-23 ENCOUNTER — Encounter (HOSPITAL_BASED_OUTPATIENT_CLINIC_OR_DEPARTMENT_OTHER): Payer: Self-pay

## 2023-09-23 DIAGNOSIS — Z1231 Encounter for screening mammogram for malignant neoplasm of breast: Secondary | ICD-10-CM | POA: Insufficient documentation

## 2023-09-23 DIAGNOSIS — Z780 Asymptomatic menopausal state: Secondary | ICD-10-CM | POA: Diagnosis not present

## 2023-09-23 DIAGNOSIS — M8588 Other specified disorders of bone density and structure, other site: Secondary | ICD-10-CM | POA: Diagnosis not present

## 2023-09-24 ENCOUNTER — Encounter: Payer: Self-pay | Admitting: Family

## 2023-09-24 ENCOUNTER — Telehealth: Payer: Self-pay | Admitting: Family

## 2023-09-24 ENCOUNTER — Other Ambulatory Visit: Payer: Self-pay

## 2023-09-24 DIAGNOSIS — M810 Age-related osteoporosis without current pathological fracture: Secondary | ICD-10-CM | POA: Insufficient documentation

## 2023-09-24 MED ORDER — APIXABAN 5 MG PO TABS: 5.0000 mg | ORAL_TABLET | Freq: Two times a day (BID) | ORAL | 5 refills | Status: DC

## 2023-09-24 MED ORDER — ALENDRONATE SODIUM 70 MG PO TABS: 70.0000 mg | ORAL_TABLET | ORAL | 11 refills | Status: DC

## 2023-09-24 NOTE — Telephone Encounter (Signed)
 Please advise pt that her bone density shows osteoporosis.  I would recommend that she begin fosamax 70mg  once wkly in the AM.  Sit upright fof 90 minutes after taking.   Continue calcium supplement.

## 2023-09-24 NOTE — Telephone Encounter (Signed)
 Patient notified of results, new medication and she agrees with plan of care.

## 2023-09-28 NOTE — Telephone Encounter (Signed)
 Patient was notified of rx for fosamax for osteoporosis. She agrees with plan of care

## 2023-10-15 ENCOUNTER — Ambulatory Visit: Payer: Medicare Other | Attending: Student | Admitting: Student

## 2023-10-15 ENCOUNTER — Telehealth: Payer: Self-pay | Admitting: Cardiovascular Disease

## 2023-10-15 ENCOUNTER — Encounter: Payer: Self-pay | Admitting: Student

## 2023-10-15 VITALS — BP 100/70 | HR 96 | Ht 69.0 in | Wt 204.0 lb

## 2023-10-15 DIAGNOSIS — R002 Palpitations: Secondary | ICD-10-CM

## 2023-10-15 DIAGNOSIS — I480 Paroxysmal atrial fibrillation: Secondary | ICD-10-CM

## 2023-10-15 DIAGNOSIS — E785 Hyperlipidemia, unspecified: Secondary | ICD-10-CM | POA: Diagnosis not present

## 2023-10-15 MED ORDER — METOPROLOL TARTRATE 25 MG PO TABS: 25.0000 mg | ORAL_TABLET | ORAL | 1 refills | Status: AC | PRN

## 2023-10-15 NOTE — Telephone Encounter (Signed)
Called and spoke to patient. Verified name and DOB. Patient report she has been having flutters since around 7 am this morning that makes her cough involuntarily. She states this morning they were more frequent and coming in waves. They are not as frequent at this time. She deny CP, SOB or any other symptoms. Scheduled patient for office visit today at 1:55 with Callie Goodrich,PA. Patient advised if symptoms worsen or new symptoms develop to go to the ED for evaluation. Patient verbalized understanding and agree.

## 2023-10-15 NOTE — Patient Instructions (Addendum)
Medication Instructions TAKE METOPROLOL TART 25MG  MAY TAKE UP TO TWICE-A-DAY AS NEEDED FOR SUSTAINED PALPITATIONS LASTING > 10 MIN DO NOT TAKE IF BP <100 SYSTOLIC *If you need a refill on your cardiac medications before your next appointment, please call your pharmacy*   Lab Work: BMET,CBC,MAG AND TSH If you have labs (blood work) drawn today and your tests are completely normal, you will receive your results only by:  MyChart Message (if you have MyChart) OR A paper copy in the mail If you have any lab test that is abnormal or we need to change your treatment, we will call you to review the results.  Other Instructions PLEASE PURCHASE KARDIA MOBILE MONITOR. DO NOT TAKE METOPROLOL TARTRATE 25MG  IF BLOOD PRESSURE IS LESS THAN 100 SYSTOLIC-TOP NUMBER  Follow-Up: At [facility], you and your health needs are our priority.  As part of our continuing mission to provide you with exceptional heart care, we have created designated Provider Care Teams.  These Care Teams include your primary Cardiologist (physician) and Advanced Practice Providers (APPs -  Physician Assistants and Nurse Practitioners) who all work together to provide you with the care you need, when you need it.  Your next appointment:   AS DISCUSSED IN APRIL   Provider:   Nanetta Batty, MD  or Marjie Skiff, PA-C

## 2023-10-15 NOTE — Telephone Encounter (Signed)
  Per MyChart scheduling message:   Initial complaint:  Heart is fluttering with an off pattern today and Dr Allyson Sabal said if it bothered me to call and come in for review. Causing a little light headedness today and sometimes I need to cough to catch breath.   Patient c/o Palpitations:  STAT if patient reporting lightheadedness, shortness of breath, or chest pain  How long have you had palpitations/irregular HR/ Afib? Are you having the symptoms now?   Are you currently experiencing lightheadedness, SOB or CP?   Do you have a history of afib (atrial fibrillation) or irregular heart rhythm?   Have you checked your BP or HR? (document readings if available):   Are you experiencing any other symptoms?    Noticed after I woke up the fluttering which has caused lightheadedness. I'm not short of breath, but do cough every now and then that's involuntary because of the flutter.  I just don't feel normal.   Blood pressure cuff at home is a little inconsistent, first time was 133/80 then again 102/78.   My pulse has been anywhere between 68-80.      I have a history of one afib episode and a heart beat that is a little inconsistent.  Feel a little cold my temp just read at 96.4 but I'm not clammy or sweaty.

## 2023-10-15 NOTE — Progress Notes (Signed)
Cardiology Office Note:    Date:  10/15/2023   ID:  [name], DOB 06/16/1946, MRN [id number]  PCP:  Sandford Craze, NP  Cardiologist:  Nanetta Batty, MD     Referring MD: Sandford Craze, NP   Chief Complaint: further evaluation of palpitations   History of Present Illness:    Kelly Burns is a 78 y.o. female with a history of paroxysmal atrial fibrillation on Eliquis, TIA, hyperlipidemia, obstructive sleep apnea, vertigo, IBS, and diverticulitis who is followed by [name] and presents today for evaluation of palpitations.  Patient is primarily followed by [name] for paroxysmal atrial fibrillation which was initially diagnosed during a hospitalization in 06/2022. Echo at that time showed LVEF of 65-70% with no significant valvular disease. She thankfully converted to sinus rhythm on her own at that time. During that admission, she reported dizziness with associated numbness in her arm/ fingers prior to admission raising concern for TIA. Head CT/ brain MRI showed no acute infarcts. Neurology recommended statin and Eliquis. Outpatient monitor following this admission showed predominant sinus rhythm with a few runs of SVT (longest lasting 16 seconds) and occasional PACs/ PVCs. Coronary calcium score in 08/2022 was 3.4 (28th for age and sex).   She was last seen by [name] in 01/2023 at which time she was stable from a cardiac standpoint. She had completed a sleep study which showed moderate obstructive sleep apnea. However, she had not been fitted for CPAP yet.   Patient called our office today with reports of "fluttering." Therefore, she was added onto my scheduled for further evaluation. She states when she woke up this morning she started having fluttering in her chest with associated fatigue and lightheadedness as well as slight shortness of breath and nausea. She denies any chest pain or syncope. Symptoms lasted for about 2 hours and then resolved. This was the  first episode of palpitations she has had since 05/2023. She states this felt similar to prior episodes when she was in atrial fibrillation. She denies any shortness of breath outside of these episodes. No orthopnea or PND. She has some chronic mild lower extremity edema (left always greater than the right) - she thinks this as been a little worse than usual but overall stable. She denies any recent fevers or illnesses. She also denies any significant caffeine intake and reports only rare alcohol use.   She is normal sinus rhythm on EKG in the office. BP is soft at 100/70 and she still feels a little lightheaded (although much improved from this morning. She attributes this to not eating lunch. Systolic BP usually in the 120s to 130s.   EKGs/Labs/Other Studies Reviewed:    The following studies were reviewed today:  Echocardiogram 06/30/2022: Impressions: 1. Limited study due to poor echo windows.   2. Left ventricular ejection fraction, by estimation, is 65 to 70%. The  left ventricle has normal function. The left ventricle has no regional  wall motion abnormalities. Diastolic function indeterminant as no tissue  doppler of MV annulus performed.   3. Right ventricular systolic function was not well visualized. The right  ventricular size is not well visualized.   4. The mitral valve is grossly normal. Trivial mitral valve  regurgitation. No evidence of mitral stenosis.   5. The aortic valve is tricuspid. Aortic valve regurgitation is not  visualized. Aortic valve sclerosis is present, with no evidence of aortic  valve stenosis.  _______________  Monitor 06/30/2022 to 07/14/2022: Patient had a min  HR of 55 bpm, max HR of 215 bpm, and avg HR of 84 bpm. Predominant underlying rhythm was Sinus Rhythm. 12 Supraventricular Tachycardia runs occurred, the run with the fastest interval lasting 7 beats with a max rate of 200 bpm, the  longest lasting 16.3 secs with an avg rate of 147 bpm. Isolated  SVEs were rare (<1.0%, 3842), SVE Couplets were rare (<1.0%, 46), and SVE Triplets were rare (<1.0%, 12). Isolated VEs were rare (<1.0%), and no VE Couplets or VE Triplets were present.    SR/SB/ST Occasional runs of SVT (lasting up to 16 sec) Occasional PACs and PVCs _______________  CT Cardiac Scoring 09/05/2022: Impression: Coronary calcium score of 3.4. This was 28th percentile for age-, race-, and sex-matched controls.   EKG:  EKG ordered today.   EKG Interpretation Date/Time:  Thursday October 15 2023 14:08:31 EST Ventricular Rate:  96 PR Interval:  132 QRS Duration:  82 QT Interval:  322 QTC Calculation: 406 R Axis:   11  Text Interpretation: Normal sinus rhythm Normal ECG When compared with ECG of 29-Jun-2022 21:54, No significant change was found Confirmed by Marjie Skiff [phone]) on 10/15/2023 2:13:33 PM    Recent Labs: 03/10/2023: ALT 18; BUN 14; Creatinine, Ser 0.74; Potassium 4.1; Sodium 140  Recent Lipid Panel    Component Value Date/Time   CHOL 154 03/10/2023 0836   TRIG 195.0 (H) 03/10/2023 0836   HDL 42.40 03/10/2023 0836   CHOLHDL 4 03/10/2023 0836   VLDL 39.0 03/10/2023 0836   LDLCALC 72 03/10/2023 0836    Physical Exam:    Vital Signs: BP 100/70 (BP Location: Left Arm, Patient Position: Sitting, Cuff Size: Large)   Pulse 96   Ht 5\' 9"  (1.753 m)   Wt 204 lb (92.5 kg)   SpO2 93%   BMI 30.13 kg/m     Wt Readings from Last 3 Encounters:  10/15/23 204 lb (92.5 kg)  07/03/23 203 lb (92.1 kg)  05/19/23 201 lb (91.2 kg)     General: 78 y.o. Caucasian female in no acute distress. HEENT: Normocephalic and atraumatic. Sclera clear.  Neck: Supple. No carotid bruits. No JVD. Heart: RRR. Distinct S1 and S2. No murmurs, gallops, or rubs.  Lungs: No increased work of breathing. Clear to ausculation bilaterally. No wheezes, rhonchi, or rales.  Abdomen: Soft, non-distended, and non-tender to palpation.  Extremities: Trace to 1+ pitting edema of  bilaterally lower extremities (left leg chronically larger than right).    Skin: Warm and dry. Neuro: No focal deficits. Psych: Normal affect. Responds appropriately.   Assessment:    1. Palpitations   2. Paroxysmal atrial fibrillation (HCC)   3. Hyperlipidemia, unspecified hyperlipidemia type     Plan:    Palpitations Paroxysmal Atrial Fibrillation Patient has a history of atrial fibrillation. She presents today after a 2 hour episode of palpitations that she described as fluttering this morning which felt similar to prior episodes of chest pain.  - EKG shows normal sinus rhythm with rates in the 90s.  - Will check BMET, Magnesium, TSH, and CBC.  - She is not currently on any AV nodal agents. Will provide Lopressor 25mg  for patient to take only PRN for sustained palpitations lasting >19 minutes. BP is soft in the office so recommended checking her BP prior to taking this and instructed her not to take if systolic BP <100.  - Continue chronic anticoagulation with Eliquis 5mg  twice daily.  - Consider formal outpatient monitor but this is patient's first episode in  about 4 months; therefore, it is very possible that we would not catch anything on formal monitor. Recommend [employer] device instead. If episodes become more frequent, we can get a formal monitor.   Hyperlipidemia Lipid panel in 02/2023: Total Cholesterol 154, Triglycerides 195, HDL 42, LDL 72. LDL goal <70 given history of possible TIA.  - Continue Crestor 10mg  daily.  - Did not have time to discuss in detail today.   Disposition: Follow up in about 2 months (before her trip out of the country).    Signed, Corrin Parker, PA-C  10/15/2023 9:59 PM    [facility]

## 2023-10-16 LAB — BASIC METABOLIC PANEL
BUN/Creatinine Ratio: 22 (ref 12–28)
BUN: 19 mg/dL (ref 8–27)
CO2: 22 mmol/L (ref 20–29)
Calcium: 9.9 mg/dL (ref 8.7–10.3)
Chloride: 105 mmol/L (ref 96–106)
Creatinine, Ser: 0.87 mg/dL (ref 0.57–1.00)
Glucose: 89 mg/dL (ref 70–99)
Potassium: 4.9 mmol/L (ref 3.5–5.2)
Sodium: 142 mmol/L (ref 134–144)
eGFR: 69 mL/min/{1.73_m2} (ref >59)

## 2023-10-16 LAB — CBC
Hematocrit: 47.3 % — ABNORMAL HIGH (ref 34.0–46.6)
Hemoglobin: 15.7 g/dL (ref 11.1–15.9)
MCH: 31.3 pg (ref 26.6–33.0)
MCHC: 33.2 g/dL (ref 31.5–35.7)
MCV: 94 fL (ref 79–97)
Platelets: 319 10*3/uL (ref 150–450)
RBC: 5.02 x10E6/uL (ref 3.77–5.28)
RDW: 12.3 % (ref 11.7–15.4)
WBC: 10.7 10*3/uL (ref 3.4–10.8)

## 2023-10-16 LAB — MAGNESIUM: Magnesium: 2.4 mg/dL — ABNORMAL HIGH (ref 1.6–2.3)

## 2023-10-16 LAB — TSH: TSH: 1.76 u[IU]/mL (ref 0.450–4.500)

## 2023-10-16 NOTE — Telephone Encounter (Signed)
Pt was here for appt 10-15-23

## 2023-11-04 DIAGNOSIS — C4492 Squamous cell carcinoma of skin, unspecified: Secondary | ICD-10-CM | POA: Insufficient documentation

## 2023-11-04 HISTORY — DX: Squamous cell carcinoma of skin, unspecified: C44.92

## 2023-11-18 DIAGNOSIS — D485 Neoplasm of uncertain behavior of skin: Secondary | ICD-10-CM | POA: Diagnosis not present

## 2023-11-18 DIAGNOSIS — C44622 Squamous cell carcinoma of skin of right upper limb, including shoulder: Secondary | ICD-10-CM | POA: Diagnosis not present

## 2023-12-02 DIAGNOSIS — C44622 Squamous cell carcinoma of skin of right upper limb, including shoulder: Secondary | ICD-10-CM | POA: Diagnosis not present

## 2023-12-16 DIAGNOSIS — L821 Other seborrheic keratosis: Secondary | ICD-10-CM | POA: Diagnosis not present

## 2023-12-25 DIAGNOSIS — N390 Urinary tract infection, site not specified: Secondary | ICD-10-CM | POA: Diagnosis not present

## 2024-01-05 ENCOUNTER — Encounter: Payer: Self-pay | Admitting: Cardiovascular Disease

## 2024-01-05 ENCOUNTER — Ambulatory Visit: Payer: Medicare Other | Attending: Cardiovascular Disease | Admitting: Cardiovascular Disease

## 2024-01-05 VITALS — BP 110/68 | HR 91 | Ht 70.0 in | Wt 205.0 lb

## 2024-01-05 DIAGNOSIS — E782 Mixed hyperlipidemia: Secondary | ICD-10-CM

## 2024-01-05 DIAGNOSIS — I480 Paroxysmal atrial fibrillation: Secondary | ICD-10-CM

## 2024-01-05 NOTE — Assessment & Plan Note (Signed)
 History of hyperlipidemia with lipid profile performed 03/10/2023 revealing total cholesterol 154, LDL 72 and HDL 42.  She is on low-dose statin therapy.

## 2024-01-05 NOTE — Progress Notes (Signed)
 01/05/2024 [name]   1946/01/31  [id number]  Primary Physician Dorrene Gaucher, NP Primary Cardiologist: Avanell Leigh MD FACP, [place], [place], [place]  HPI:  Kelly Burns is a 78 y.o.   moderately overweight widowed Caucasian female mother of 3, grandmother of 5 grandchildren who I last saw in the office 02/10/2023.Aaron Aas She was referred by the hospital for recent episode of PAF.  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 She lives alone and is independent. She does have mild hyperlipidemia on statin therapy. She is never had a heart attack or documented stroke although there is a question of TIA. She was recently hospital hospitalized on 06/29/2022 for 1 day. She was initially in A-fib with RVR and converted to sinus rhythm. She was was begun on Eliquis  oral anticoagulation. 2D echo was normal and head CT/MRI showed no evidence of stroke. She does complain of some dyspnea on exertion over the last year but denies chest pain.   She had a coronary calcium  score performed 09/05/2022 which was low at 3.  A 2-week event monitor showed occasional PACs, PVCs and short runs of SVT but no PAF.  She did have a sleep study which was significant for moderate obstructive sleep apnea although with the addition of Singulair  and humidifier her symptoms have improved.  Since I saw her a year ago she did see Ardis Krebs, PA-C in the office 10/15/2023 for tachypalpitations although her EKG showed sinus rhythm.  She was prescribed as needed metoprolol .  She has had no further episodes since that time.  She denies chest pain or shortness of breath.  She is scheduled to go to [place] in the near future with her son and daughter as a gift for her double [birth year]ub[birth year] birthday.   Current Meds  Medication Sig   alendronate  (FOSAMAX ) 70 MG tablet Take 1 tablet (70 mg total) by mouth every 7 (seven) days. Take with a full glass of water on an empty  stomach.   apixaban  (ELIQUIS ) 5 MG TABS tablet Take 1 tablet (5 mg total) by mouth 2 (two) times daily.   calcium -vitamin D  (OSCAL WITH D) 500-5 MG-MCG tablet Take 1 tablet by mouth daily with breakfast.   fluticasone  (FLONASE  ALLERGY RELIEF) 50 MCG/ACT nasal spray Place 2 sprays into both nostrils daily.   loratadine  (CLARITIN ) 10 MG tablet Take 1 tablet (10 mg total) by mouth daily.   montelukast  (SINGULAIR ) 10 MG tablet Take 1 tablet (10 mg total) by mouth at bedtime.   Multiple Vitamin (MULTIVITAMIN) tablet Take 1 tablet by mouth daily.   PREMARIN vaginal cream Place 1 g vaginally once a week.   Probiotic Product (ALIGN PO) Take 1 capsule by mouth every other day.   rosuvastatin  (CRESTOR ) 10 MG tablet Take 10 mg by mouth daily.     Allergies  Allergen Reactions   Bactrim [Sulfamethoxazole-Trimethoprim] Rash    Rash broke out on stomach   Other Itching and Rash    CSF-HC powder applied to the mastoid cavity results in severe itching and ear drainage.    Social History   Socioeconomic History   Marital status: Widowed    Spouse name: Not on file   Number of children: 3   Years of education: Not on file   Highest education level: Some college, no degree  Occupational History   Not on file  Tobacco Use   Smoking status: Former  Current packs/day: 0.00    Types: Cigarettes    Quit date: 09/15/2002    Years since quitting: 21.3   Smokeless tobacco: Never  Vaping Use   Vaping status: Never Used  Substance and Sexual Activity   Alcohol use: Yes    Comment: social   Drug use: No   Sexual activity: Not Currently  Other Topics Concern   Not on file  Social History Narrative   Lives at home alone    Widowed (husband died 02/25/13)   Right handed   Caffeine: decaf coffee 0-2 cups/day   1 adopted son   1 biological son   1 biological daughter   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    Social Drivers of Health    Financial Resource Strain: Medium Risk (05/12/2023)   Overall Financial Resource Strain (CARDIA)    Difficulty of Paying Living Expenses: Somewhat hard  Food Insecurity: No Food Insecurity (05/12/2023)   Hunger Vital Sign    Worried About Running Out of Food in the Last Year: Never true    Ran Out of Food in the Last Year: Never true  Transportation Needs: No Transportation Needs (05/12/2023)   PRAPARE - Administrator, Civil Service (Medical): No    Lack of Transportation (Non-Medical): No  Physical Activity: Insufficiently Active (05/12/2023)   Exercise Vital Sign    Days of Exercise per Week: 2 days    Minutes of Exercise per Session: 30 min  Stress: Stress Concern Present (05/12/2023)   [employer] of Occupational Health - Occupational Stress Questionnaire    Feeling of Stress : To some extent  Social Connections: Moderately Integrated (05/12/2023)   Social Connection and Isolation Panel [NHANES]    Frequency of Communication with Friends and Family: Three times a week    Frequency of Social Gatherings with Friends and Family: Three times a week    Attends Religious Services: More than 4 times per year    Active Member of Clubs or Organizations: Yes    Attends Banker Meetings: More than 4 times per year    Marital Status: Widowed  Intimate Partner Violence: Unknown (11/09/2022)   Received from [facility]   HITS    Physically Hurt: Not on file    Insult or Talk Down To: Not on file    Threaten Physical Harm: Not on file    Scream or Curse: Not on file     Review of Systems: General: negative for chills, fever, night sweats or weight changes.  Cardiovascular: negative for chest pain, dyspnea on exertion, edema, orthopnea, palpitations, paroxysmal nocturnal dyspnea or shortness of breath Dermatological: negative for rash Respiratory: negative for cough or wheezing Urologic: negative for hematuria Abdominal: negative for nausea,  vomiting, diarrhea, bright red blood per rectum, melena, or hematemesis Neurologic: negative for visual changes, syncope, or dizziness All other systems reviewed and are otherwise negative except as noted above.    Blood pressure 110/68, pulse 91, height 5\' 10"  (1.778 m), weight 205 lb (93 kg), SpO2 95%.  General appearance: alert and no distress Neck: no adenopathy, no carotid bruit, no JVD, supple, symmetrical, trachea midline, and thyroid  not enlarged, symmetric, no tenderness/mass/nodules Lungs: clear to auscultation bilaterally Heart: regular rate and rhythm, S1, S2 normal, no murmur, click, rub or gallop Extremities: extremities normal, atraumatic, no cyanosis or edema Pulses: 2+ and symmetric Skin: Skin color, texture, turgor normal. No rashes or lesions Neurologic:  Grossly normal  EKG not performed today      ASSESSMENT AND PLAN:   Hyperlipidemia History of hyperlipidemia with lipid profile performed 03/10/2023 revealing total cholesterol 154, LDL 72 and HDL 42.  She is on low-dose statin therapy.  PAF (paroxysmal atrial fibrillation) (HCC) History of PAF maintaining sinus rhythm on Eliquis  oral anticoagulation.     Avanell Leigh MD FACP,FACC,FAHA, [facility] 01/05/2024 9:08 AM

## 2024-01-05 NOTE — Assessment & Plan Note (Signed)
 History of PAF maintaining sinus rhythm on Eliquis oral anticoagulation.

## 2024-01-05 NOTE — Patient Instructions (Signed)
 Medication Instructions:  Your physician recommends that you continue on your current medications as directed. Please refer to the Current Medication list given to you today.  *If you need a refill on your cardiac medications before your next appointment, please call your pharmacy*  Follow-Up: At [facility], you and your health needs are our priority.  As part of our continuing mission to provide you with exceptional heart care, our providers are all part of one team.  This team includes your primary Cardiologist (physician) and Advanced Practice Providers or APPs (Physician Assistants and Nurse Practitioners) who all work together to provide you with the care you need, when you need it.  Your next appointment:   12 month(s)  Provider:   Callie Goodrich, PA-C       Then, Lauro Portal, MD will plan to see you again in 2 year(s).    We recommend signing up for the patient portal called "MyChart".  Sign up information is provided on this After Visit Summary.  MyChart is used to connect with patients for Virtual Visits (Telemedicine).  Patients are able to view lab/test results, encounter notes, upcoming appointments, etc.  Non-urgent messages can be sent to your provider as well.   To learn more about what you can do with MyChart, go to [email or web address].   Other Instructions       1st Floor: - Lobby - Registration  - Pharmacy  - Lab - Cafe  2nd Floor: - PV Lab - Diagnostic Testing (echo, CT, nuclear med)  3rd Floor: - Vacant  4th Floor: - TCTS (cardiothoracic surgery) - AFib Clinic - Structural Heart Clinic - Vascular Surgery  - Vascular Ultrasound  5th Floor: - HeartCare Cardiology (general and EP) - Clinical Pharmacy for coumadin, hypertension, lipid, weight-loss medications, and med management appointments    Valet parking services will be available as well.

## 2024-01-06 ENCOUNTER — Ambulatory Visit (INDEPENDENT_AMBULATORY_CARE_PROVIDER_SITE_OTHER): Payer: Medicare Other | Admitting: Family

## 2024-01-06 ENCOUNTER — Encounter: Payer: Self-pay | Admitting: Family

## 2024-01-06 VITALS — HR 90 | Temp 98.6°F | Resp 16 | Ht 70.0 in | Wt 204.0 lb

## 2024-01-06 DIAGNOSIS — E782 Mixed hyperlipidemia: Secondary | ICD-10-CM | POA: Diagnosis not present

## 2024-01-06 DIAGNOSIS — M810 Age-related osteoporosis without current pathological fracture: Secondary | ICD-10-CM

## 2024-01-06 DIAGNOSIS — J309 Allergic rhinitis, unspecified: Secondary | ICD-10-CM | POA: Diagnosis not present

## 2024-01-06 DIAGNOSIS — I480 Paroxysmal atrial fibrillation: Secondary | ICD-10-CM | POA: Diagnosis not present

## 2024-01-06 DIAGNOSIS — M546 Pain in thoracic spine: Secondary | ICD-10-CM | POA: Insufficient documentation

## 2024-01-06 DIAGNOSIS — C4492 Squamous cell carcinoma of skin, unspecified: Secondary | ICD-10-CM | POA: Diagnosis not present

## 2024-01-06 LAB — COMPREHENSIVE METABOLIC PANEL WITH GFR
ALT: 19 U/L (ref 0–35)
AST: 19 U/L (ref 0–37)
Albumin: 4.5 g/dL (ref 3.5–5.2)
Alkaline Phosphatase: 57 U/L (ref 39–117)
BUN: 14 mg/dL (ref 6–23)
CO2: 30 meq/L (ref 19–32)
Calcium: 10 mg/dL (ref 8.4–10.5)
Chloride: 107 meq/L (ref 96–112)
Creatinine, Ser: 0.77 mg/dL (ref 0.40–1.20)
GFR: 74.96 mL/min (ref >60.00)
Glucose, Bld: 110 mg/dL — ABNORMAL HIGH (ref 70–99)
Potassium: 4.5 meq/L (ref 3.5–5.1)
Sodium: 142 meq/L (ref 135–145)
Total Bilirubin: 0.6 mg/dL (ref 0.2–1.2)
Total Protein: 6.5 g/dL (ref 6.0–8.3)

## 2024-01-06 LAB — LIPID PANEL
Cholesterol: 152 mg/dL (ref 0–200)
HDL: 45.1 mg/dL (ref >39.00)
LDL Cholesterol: 66 mg/dL (ref 0–99)
NonHDL: 106.81
Total CHOL/HDL Ratio: 3
Triglycerides: 205 mg/dL — ABNORMAL HIGH (ref 0.0–149.0)
VLDL: 41 mg/dL — ABNORMAL HIGH (ref 0.0–40.0)

## 2024-01-06 MED ORDER — MONTELUKAST SODIUM 10 MG PO TABS
10.0000 mg | ORAL_TABLET | Freq: Every day | ORAL | 1 refills | Status: AC
Start: 2024-01-06 — End: ?

## 2024-01-06 MED ORDER — ROSUVASTATIN CALCIUM 10 MG PO TABS: 10.0000 mg | ORAL_TABLET | Freq: Every day | ORAL | 1 refills | Status: DC

## 2024-01-06 NOTE — Progress Notes (Addendum)
 Subjective:     Patient ID: Kelly Burns, female    DOB: Jan 30, 1946, 78 y.o.   MRN: [id number]  Chief Complaint  Patient presents with   Hypertension    Here for follow up    Hyperlipidemia    Here for follow up    HPI  Discussed the use of AI scribe software for clinical note transcription with the patient, who gave verbal consent to proceed.  History of Present Illness  She is a 78 year old female who presents for a regular follow-up visit and medication refill.  She requires a refill for her rosuvastatin  and Singulair . Due to a severe allergy season, she switched from Claritin  to Zyrtec as Claritin  was less effective. Singulair  has been beneficial, especially at night, aiding in keeping her airways open and improving her breathing. A CPAP machine has been recommended but she declines at this time due to claustrophobia.   She recently had a skin cancer lesion removed, attributed to accumulated sun damage. She recalls experiencing severe sunburns in the past and is concerned about the potential for more lesions to appear, given her history of sun exposure.  She experienced significant back pain after engaging in household activities such as mopping and preparing food. The pain was severe enough that she could barely move the following day. Ice application provided relief, and the pain improved over the next few days. She is generally active, caring for her granddaughter daily, but not overly so. No current back pain.  She is currently taking Eliquis  for atrial fibrillation, initially prescribed after a suspected mini-stroke. She is concerned about the cost of Eliquis  but acknowledges its necessity due to her condition. She also takes a calcium  supplement with vitamin D  and plans to increase her intake to 1200 mg per day.  She reports changes in her bowel movements, describing them as 'thick mud' rather than formed. She consumes a lot of fruits and some salads but fewer vegetables.  She drinks a glass of milk daily and has not noticed any correlation between dairy intake and her bowel habits. No diarrhea or urgency.  She is planning a trip to [place] with her family and is preparing by acquiring a hat and sunscreen to protect against sun exposure.     Health Maintenance Due  Topic Date Due   Zoster Vaccines- Shingrix (1 of 2) Never done   COVID-19 Vaccine (2 - 2024-25 season) 12/15/2023    Past Medical History:  Diagnosis Date   Adhesive capsulitis of left shoulder 05/11/2013   Arthritis    Cataract    Community acquired pneumonia of right lower lobe of lung 06/29/2022   Deafness in right ear    Diverticulitis    one episode   fibroadenoma right breast 05/11/2013   See U/S 03/2013   Hyperlipidemia    IBS (irritable bowel syndrome)    urgency   PAF (paroxysmal atrial fibrillation) (HCC)    Squamous cell skin cancer 11/04/2023   right arm skin cancer removal   TIA (transient ischemic attack)    Vertigo     Past Surgical History:  Procedure Laterality Date   APPENDECTOMY     BLADDER SUSPENSION  2020   BREAST BIOPSY Right    reports that it was a cyst   CHOLECYSTECTOMY     MIDDLE EAR SURGERY     skin cancer removal  2025   Right arm (? MOH's)   TONSILLECTOMY      Family History  Problem Relation Age of  Onset   Heart disease Mother    Dementia Father    Cancer Maternal Grandmother        breast   Breast cancer Maternal Grandmother    Cancer Maternal Aunt        colon   Breast cancer Cousin     Social History   Socioeconomic History   Marital status: Widowed    Spouse name: Not on file   Number of children: 3   Years of education: Not on file   Highest education level: Some college, no degree  Occupational History   Not on file  Tobacco Use   Smoking status: Former    Current packs/day: 0.00    Types: Cigarettes    Quit date: 09/15/2002    Years since quitting: 21.3   Smokeless tobacco: Never  Vaping Use   Vaping status: Never  Used  Substance and Sexual Activity   Alcohol use: Yes    Comment: social   Drug use: No   Sexual activity: Not Currently  Other Topics Concern   Not on file  Social History Narrative   Lives at home alone    Widowed (husband died 06/17/13)   Right handed   Caffeine: decaf coffee 0-2 cups/day   1 adopted son   1 biological son   1 biological daughter   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    Social Drivers of Health   Financial Resource Strain: Medium Risk (01/05/2024)   Overall Financial Resource Strain (CARDIA)    Difficulty of Paying Living Expenses: Somewhat hard  Food Insecurity: No Food Insecurity (01/05/2024)   Hunger Vital Sign    Worried About Running Out of Food in the Last Year: Never true    Ran Out of Food in the Last Year: Never true  Transportation Needs: No Transportation Needs (01/05/2024)   PRAPARE - Administrator, Civil Service (Medical): No    Lack of Transportation (Non-Medical): No  Physical Activity: Insufficiently Active (05/12/2023)   Exercise Vital Sign    Days of Exercise per Week: 2 days    Minutes of Exercise per Session: 30 min  Stress: No Stress Concern Present (01/05/2024)   [employer] of Occupational Health - Occupational Stress Questionnaire    Feeling of Stress : Only a little  Social Connections: Moderately Integrated (01/05/2024)   Social Connection and Isolation Panel [NHANES]    Frequency of Communication with Friends and Family: More than three times a week    Frequency of Social Gatherings with Friends and Family: Three times a week    Attends Religious Services: More than 4 times per year    Active Member of Clubs or Organizations: Yes    Attends Banker Meetings: More than 4 times per year    Marital Status: Widowed  Intimate Partner Violence: Unknown (11/09/2022)   Received from [facility]   HITS    Physically Hurt: Not on file     Insult or Talk Down To: Not on file    Threaten Physical Harm: Not on file    Scream or Curse: Not on file    Outpatient Medications Prior to Visit  Medication Sig Dispense Refill   alendronate  (FOSAMAX ) 70 MG tablet Take 1 tablet (70 mg total) by mouth every 7 (seven) days. Take with a full glass of water on an empty stomach. 4 tablet 11   apixaban  (ELIQUIS ) 5 MG TABS tablet  Take 1 tablet (5 mg total) by mouth 2 (two) times daily. 60 tablet 5   calcium -vitamin D  (OSCAL WITH D) 500-5 MG-MCG tablet Take 1 tablet by mouth daily with breakfast.     fluticasone  (FLONASE  ALLERGY RELIEF) 50 MCG/ACT nasal spray Place 2 sprays into both nostrils daily. 16 g 5   loratadine  (CLARITIN ) 10 MG tablet Take 1 tablet (10 mg total) by mouth daily. 90 tablet 4   metoprolol  tartrate (LOPRESSOR ) 25 MG tablet Take 1 tablet (25 mg total) by mouth as needed for up to 1 dose (MAY TAKE UP TO TWICE-A-DAY AS NEEDED FOR SUSTAINED PALP LASTING > 10 MIN.). 30 tablet 1   Multiple Vitamin (MULTIVITAMIN) tablet Take 1 tablet by mouth daily.     PREMARIN vaginal cream Place 1 g vaginally once a week.     Probiotic Product (ALIGN PO) Take 1 capsule by mouth every other day.     montelukast  (SINGULAIR ) 10 MG tablet Take 1 tablet (10 mg total) by mouth at bedtime. 90 tablet 1   rosuvastatin  (CRESTOR ) 10 MG tablet Take 10 mg by mouth daily.     No facility-administered medications prior to visit.    Allergies  Allergen Reactions   Bactrim [Sulfamethoxazole-Trimethoprim] Rash    Rash broke out on stomach   Other Itching and Rash    CSF-HC powder applied to the mastoid cavity results in severe itching and ear drainage.    ROS     Objective:    Physical Exam Constitutional:      General: She is not in acute distress.    Appearance: Normal appearance. She is well-developed.  HENT:     Head: Normocephalic and atraumatic.     Right Ear: External ear normal.     Left Ear: External ear normal.  Eyes:     General:  No scleral icterus. Neck:     Thyroid : No thyromegaly.  Cardiovascular:     Rate and Rhythm: Normal rate and regular rhythm.     Heart sounds: Normal heart sounds. No murmur heard. Pulmonary:     Effort: Pulmonary effort is normal. No respiratory distress.     Breath sounds: Normal breath sounds. No wheezing.  Musculoskeletal:        General: No swelling.     Cervical back: Neck supple.  Skin:    General: Skin is warm and dry.     Comments: Scar right forearm from skin cancer removal  Neurological:     Mental Status: She is alert and oriented to person, place, and time.  Psychiatric:        Mood and Affect: Mood normal.        Behavior: Behavior normal.        Thought Content: Thought content normal.        Judgment: Judgment normal.      Pulse 90   Temp 98.6 F (37 C) (Oral)   Resp 16   Ht 5' 10 (1.778 m)   Wt 204 lb (92.5 kg)   SpO2 95%   BMI 29.27 kg/m  Wt Readings from Last 3 Encounters:  01/06/24 204 lb (92.5 kg)  01/05/24 205 lb (93 kg)  10/15/23 204 lb (92.5 kg)       Assessment & Plan:   Problem List Items Addressed This Visit       Unprioritized   Squamous cell skin cancer   S/p removal.  Excision site appears to be healing well.       PAF (  paroxysmal atrial fibrillation) (HCC)   Rate stable. Followed by cardiology, maintained on Eliquis .      Relevant Medications   rosuvastatin  (CRESTOR ) 10 MG tablet   Osteoporosis   Continues fosamax /calcium . Bone density up to date.       Midline thoracic back pain   Now improved.  Acute back pain likely from overexertion, improved with ice. Arthritis may contribute. NSAIDs contraindicated due to Eliquis . - Use Tylenol  for pain management. - Apply ice or heating pad as needed. - Perform stretching exercises if pain flares up.      Hyperlipidemia - Primary   Lab Results  Component Value Date   CHOL 154 03/10/2023   HDL 42.40 03/10/2023   LDLCALC 72 03/10/2023   TRIG 195.0 (H) 03/10/2023    CHOLHDL 4 03/10/2023   Last LDL was stable.  Will update lipid panel and continue crestor .       Relevant Medications   rosuvastatin  (CRESTOR ) 10 MG tablet   Other Relevant Orders   Lipid panel   Comp Met (CMET)   Allergic rhinitis    Severe allergic rhinitis managed with Zyrtec and Singulair . Humidifier used for breathing support. - Continue Zyrtec for allergy management. - Refill Singulair  prescription.      Relevant Medications   montelukast  (SINGULAIR ) 10 MG tablet    I have changed Glendale B. Mol's rosuvastatin . I am also having her maintain her Probiotic Product (ALIGN PO), multivitamin, Premarin, calcium -vitamin D , loratadine , fluticasone , alendronate , apixaban , metoprolol  tartrate, and montelukast .  Meds ordered this encounter  Medications   montelukast  (SINGULAIR ) 10 MG tablet    Sig: Take 1 tablet (10 mg total) by mouth at bedtime.    Dispense:  90 tablet    Refill:  1   rosuvastatin  (CRESTOR ) 10 MG tablet    Sig: Take 1 tablet (10 mg total) by mouth daily.    Dispense:  90 tablet    Refill:  1

## 2024-01-06 NOTE — Assessment & Plan Note (Signed)
 Rate stable. Followed by cardiology, maintained on Eliquis .

## 2024-01-06 NOTE — Assessment & Plan Note (Signed)
  Severe allergic rhinitis managed with Zyrtec and Singulair . Humidifier used for breathing support. - Continue Zyrtec for allergy management. - Refill Singulair  prescription.

## 2024-01-06 NOTE — Assessment & Plan Note (Signed)
 Lab Results  Component Value Date   CHOL 154 03/10/2023   HDL 42.40 03/10/2023   LDLCALC 72 03/10/2023   TRIG 195.0 (H) 03/10/2023   CHOLHDL 4 03/10/2023   Last LDL was stable.  Will update lipid panel and continue crestor .

## 2024-01-06 NOTE — Assessment & Plan Note (Addendum)
 Continues fosamax /calcium . Bone density up to date.

## 2024-01-06 NOTE — Assessment & Plan Note (Signed)
 S/p removal.  Excision site appears to be healing well.

## 2024-01-06 NOTE — Assessment & Plan Note (Signed)
 Now improved.  Acute back pain likely from overexertion, improved with ice. Arthritis may contribute. NSAIDs contraindicated due to Eliquis . - Use Tylenol  for pain management. - Apply ice or heating pad as needed. - Perform stretching exercises if pain flares up.

## 2024-01-06 NOTE — Patient Instructions (Signed)
 VISIT SUMMARY:  You came in today for a regular follow-up visit and medication refill. We discussed your current medications, recent health concerns, and preventive measures for your upcoming trip.  YOUR PLAN:  -BACK PAIN: Your back pain is likely due to overexertion and has improved with ice application. Arthritis may also be a contributing factor. You should use Tylenol  for pain management, apply ice or a heating pad as needed, and perform stretching exercises if the pain flares up.  -ATRIAL FIBRILLATION: Atrial fibrillation is an irregular and often rapid heart rate that can increase your risk of strokes. You should continue taking Eliquis  to prevent strokes. We discussed your concerns about the cost and explored patient assistance options.  -ALLERGIC RHINITIS: Allergic rhinitis is an allergic reaction that causes sneezing, congestion, and a runny nose. You should continue taking Zyrtec for allergy management and we have refilled your Singulair  prescription.  -HISTORY OF SKIN CANCER: You recently had a skin cancer lesion removed due to sun damage. It is important to protect your skin from the sun, especially during your upcoming trip. Use sunscreen, wear long sleeves, and hats for sun protection.  Brodie Cannon VISIT: During your routine follow-up, we recommended increasing your calcium  intake to 1200 mg daily. You should take two tablets of your current calcium  supplement with vitamin D  each day. We also ordered lab tests to check your cholesterol, kidney, and liver function.  INSTRUCTIONS:  Please follow up with the lab tests for cholesterol, kidney, and liver function as discussed. Continue with your current medications and follow the recommendations provided for each of your health concerns. Enjoy your trip to [place] and remember to protect yourself from the sun.

## 2024-01-07 ENCOUNTER — Encounter: Payer: Self-pay | Admitting: Family

## 2024-04-05 ENCOUNTER — Other Ambulatory Visit: Payer: Self-pay | Admitting: Family

## 2024-04-05 NOTE — Telephone Encounter (Signed)
 Copied from CRM [id number]. Topic: Clinical - Prescription Issue >> Apr 05, 2024  9:46 AM Jasmin G wrote: Reason for CRM: Pt has contacted pharmacy about her apixaban  (ELIQUIS ) 5 MG TABS tablet refill, but apparently clinic has not given authorization to refill, pt will be out of this medication tomorrow morning and is supposed to take it twice per day, she believes that she is not supposed to stop taking this specific med for her well being, so she would like to know if she could get a 7 day refill or something similar in the meantime. Please call pt back ASAP to establish best plan of action.

## 2024-04-05 NOTE — Telephone Encounter (Signed)
Rx was sent and pt has been notified

## 2024-04-14 ENCOUNTER — Telehealth: Payer: Self-pay | Admitting: Family

## 2024-04-14 NOTE — Telephone Encounter (Signed)
 Copied from CRM [id number]. Topic: Medicare AWV >> Apr 14, 2024 11:01 AM Nathanel DEL wrote: Reason for CRM: Called LVM 04/14/2024 to schedule AWV. Please schedule Virtual or Telehealth visits ONLY.   Nathanel Paschal; Care Guide Ambulatory Clinical Support [place] [facility] Direct Dial: [phone]

## 2024-05-22 ENCOUNTER — Ambulatory Visit
Admission: RE | Admit: 2024-05-22 | Discharge: 2024-05-22 | Disposition: A | Discharge status: Home / Self Care | Source: Ambulatory Visit | Attending: Family Medicine | Admitting: Family Medicine

## 2024-05-22 ENCOUNTER — Ambulatory Visit: Payer: Self-pay | Admitting: Nurse Practitioner

## 2024-05-22 ENCOUNTER — Ambulatory Visit (INDEPENDENT_AMBULATORY_CARE_PROVIDER_SITE_OTHER)

## 2024-05-22 ENCOUNTER — Other Ambulatory Visit: Payer: Self-pay

## 2024-05-22 VITALS — BP 117/71 | HR 89 | Temp 98.9°F | Resp 17

## 2024-05-22 DIAGNOSIS — R509 Fever, unspecified: Secondary | ICD-10-CM | POA: Diagnosis not present

## 2024-05-22 DIAGNOSIS — R051 Acute cough: Secondary | ICD-10-CM

## 2024-05-22 DIAGNOSIS — R059 Cough, unspecified: Secondary | ICD-10-CM | POA: Diagnosis not present

## 2024-05-22 DIAGNOSIS — J40 Bronchitis, not specified as acute or chronic: Secondary | ICD-10-CM

## 2024-05-22 DIAGNOSIS — J329 Chronic sinusitis, unspecified: Secondary | ICD-10-CM

## 2024-05-22 DIAGNOSIS — R918 Other nonspecific abnormal finding of lung field: Secondary | ICD-10-CM | POA: Diagnosis not present

## 2024-05-22 MED ORDER — PSEUDOEPH-BROMPHEN-DM 30-2-10 MG/5ML PO SYRP: 5.0000 mL | ORAL_SOLUTION | Freq: Two times a day (BID) | ORAL | 0 refills | Status: AC | PRN

## 2024-05-22 MED ORDER — AMOXICILLIN-POT CLAVULANATE 875-125 MG PO TABS: 1.0000 | ORAL_TABLET | Freq: Two times a day (BID) | ORAL | 0 refills | Status: AC

## 2024-05-22 MED ORDER — AZITHROMYCIN 250 MG PO TABS: 250.0000 mg | ORAL_TABLET | Freq: Every day | ORAL | 0 refills | Status: DC

## 2024-05-22 NOTE — ED Triage Notes (Signed)
 Pt c/o productive cough w/green mucous, temp 100.6, unable to lay down due to have trouble breathing, chest congestion, throat [id number].

## 2024-05-22 NOTE — Discharge Instructions (Addendum)
 Start Augmentin  twice daily for 10 days.  Bromfed twice daily as needed for your cough.  You may do nasal rinses as tolerated and continue Flonase .  Lots of rest and fluids.  Please follow-up with your PCP in 2 to 3 days for recheck.  Please go to the ER if you develop any worsening symptoms.  Hope you feel better soon!

## 2024-05-22 NOTE — ED Provider Notes (Addendum)
 [facility]    CSN: [id number] Arrival date & time: 05/22/24  1255      History   Chief Complaint Chief Complaint  Patient presents with   Cough    Bad cough, soar throat, and fever tonight of 100.   Been going on and off since Sunday evening 8/31 progressively gotten worse. - Entered by patient    HPI Kelly Burns is a 78 y.o. female  presents for evaluation of URI symptoms for 10 days. Patient reports associated symptoms of cough with purulent sputum, congestion, sinus pressure/pain, postnasal drip with sore throat, of nightly fevers. Denies N/V/D, ear pain, shortness of breath. Patient does not have a hx of asthma. Patient is not an active smoker.   Reports no known sick contacts.  Pt has taken Tylenol  OTC for symptoms. Pt has no other concerns at this time.    Cough Associated symptoms: fever and sore throat     Past Medical History:  Diagnosis Date   Adhesive capsulitis of left shoulder 05/11/2013   Arthritis    Cataract    Community acquired pneumonia of right lower lobe of lung 06/29/2022   Deafness in right ear    Diverticulitis    one episode   fibroadenoma right breast 05/11/2013   See U/S 03/2013   Hyperlipidemia    IBS (irritable bowel syndrome)    urgency   PAF (paroxysmal atrial fibrillation) (HCC)    Squamous cell skin cancer 11/04/2023   right arm skin cancer removal   TIA (transient ischemic attack)    Vertigo     Patient Active Problem List   Diagnosis Date Noted   Midline thoracic back pain 01/06/2024   Squamous cell skin cancer 11/04/2023   Osteoporosis 09/24/2023   Allergic 07/03/2023   Folliculitis 07/03/2023   Vaginal dryness 07/03/2023   Preventative health care 05/20/2023   TIA (transient ischemic attack) 03/10/2023   Dyspnea on exertion 07/22/2022   PAF (paroxysmal atrial fibrillation) (HCC) 06/29/2022   Sinus pause 06/29/2022   Allergic rhinitis 11/21/2013   FH: breast cancer 05/30/2013   Cholesteatoma of right ear  05/11/2013   Vertigo 05/11/2013   Stress incontinence 05/11/2013   Menopause 02/16/2013   Hyperlipidemia 02/16/2013   Situational stress 02/16/2013   Insomnia 02/16/2013   Mixed hearing loss, unilateral 01/27/2012    Past Surgical History:  Procedure Laterality Date   APPENDECTOMY     BLADDER SUSPENSION  2020   BREAST BIOPSY Right    reports that it was a cyst   CHOLECYSTECTOMY     MIDDLE EAR SURGERY     skin cancer removal  2025   Right arm (? MOH's)   TONSILLECTOMY      OB History   No obstetric history on file.      Home Medications    Prior to Admission medications   Medication Sig Start Date End Date Taking? Authorizing Provider  amoxicillin -clavulanate (AUGMENTIN ) 875-125 MG tablet Take 1 tablet by mouth every 12 (twelve) hours for 10 days. 05/22/24 06/01/24 Yes Ilayda Toda, Jodi R, NP  azithromycin  (ZITHROMAX ) 250 MG tablet Take 1 tablet (250 mg total) by mouth daily. Take first 2 tablets together, then 1 every day until finished. 05/22/24  Yes Inri Sobieski, Jodi R, NP  brompheniramine-pseudoephedrine-DM 30-2-10 MG/5ML syrup Take 5 mLs by mouth 2 (two) times daily as needed for up to 5 days. 05/22/24 05/27/24 Yes Brittley Regner, Jodi R, NP  alendronate  (FOSAMAX ) 70 MG tablet Take 1 tablet (70 mg total) by mouth  every 7 (seven) days. Take with a full glass of water on an empty stomach. 09/24/23   O'Sullivan, Melissa, NP  apixaban  (ELIQUIS ) 5 MG TABS tablet Take 1 tablet by mouth twice daily 04/05/24   O'Sullivan, Melissa, NP  calcium -vitamin D  (OSCAL WITH D) 500-5 MG-MCG tablet Take 1 tablet by mouth daily with breakfast.    [provider]  fluticasone  (FLONASE  ALLERGY RELIEF) 50 MCG/ACT nasal spray Place 2 sprays into both nostrils daily. 07/03/23   O'Sullivan, Melissa, NP  loratadine  (CLARITIN ) 10 MG tablet Take 1 tablet (10 mg total) by mouth daily. 07/03/23   O'Sullivan, Melissa, NP  metoprolol  tartrate (LOPRESSOR ) 25 MG tablet Take 1 tablet (25 mg total) by mouth as needed for up to 1  dose (MAY TAKE UP TO TWICE-A-DAY AS NEEDED FOR SUSTAINED PALP LASTING > 10 MIN.). 10/15/23   Goodrich, Callie E, PA-C  montelukast  (SINGULAIR ) 10 MG tablet Take 1 tablet (10 mg total) by mouth at bedtime. 01/06/24   O'Sullivan, Melissa, NP  Multiple Vitamin (MULTIVITAMIN) tablet Take 1 tablet by mouth daily.    [provider]  PREMARIN vaginal cream Place 1 g vaginally once a week. 05/23/22   [provider]  Probiotic Product (ALIGN PO) Take 1 capsule by mouth every other day.    [provider]  rosuvastatin  (CRESTOR ) 10 MG tablet Take 1 tablet (10 mg total) by mouth daily. 01/06/24   Daryl Setter, NP    Family History Family History  Problem Relation Age of Onset   Heart disease Mother    Dementia Father    Cancer Maternal Grandmother        breast   Breast cancer Maternal Grandmother    Cancer Maternal Aunt        colon   Breast cancer Cousin     Social History Social History   Tobacco Use   Smoking status: Former    Current packs/day: 0.00    Types: Cigarettes    Quit date: 09/15/2002    Years since quitting: 21.6   Smokeless tobacco: Never  Vaping Use   Vaping status: Never Used  Substance Use Topics   Alcohol use: Yes    Comment: social   Drug use: No     Allergies   Bactrim [sulfamethoxazole-trimethoprim] and Other   Review of Systems Review of Systems  Constitutional:  Positive for fever.  HENT:  Positive for congestion, postnasal drip and sore throat.   Respiratory:  Positive for cough.      Physical Exam Triage Vital Signs ED Triage Vitals  Encounter Vitals Group     BP 05/22/24 1434 117/71     Girls Systolic BP Percentile --      Girls Diastolic BP Percentile --      Boys Systolic BP Percentile --      Boys Diastolic BP Percentile --      Pulse Rate 05/22/24 1434 89     Resp 05/22/24 1434 17     Temp 05/22/24 1434 98.9 F (37.2 C)     Temp Source 05/22/24 1434 Oral     SpO2 05/22/24 1434 91 %     Weight --       Height --      Head Circumference --      Peak Flow --      Pain Score 05/22/24 1431 6     Pain Loc --      Pain Education --      Exclude from [employer]  Chart --    No data found.  Updated Vital Signs BP 117/71   Pulse 89   Temp 98.9 F (37.2 C) (Oral)   Resp 17   SpO2 91%   Visual Acuity Right Eye Distance:   Left Eye Distance:   Bilateral Distance:    Right Eye Near:   Left Eye Near:    Bilateral Near:     Physical Exam Vitals and nursing note reviewed.  Constitutional:      General: She is not in acute distress.    Appearance: She is well-developed. She is not ill-appearing.  HENT:     Head: Normocephalic and atraumatic.     Right Ear: Tympanic membrane and ear canal normal.     Left Ear: Tympanic membrane and ear canal normal.     Nose: Congestion present.     Right Turbinates: Swollen and pale.     Left Turbinates: Swollen and pale.     Right Sinus: Maxillary sinus tenderness present. No frontal sinus tenderness.     Left Sinus: Maxillary sinus tenderness present. No frontal sinus tenderness.     Mouth/Throat:     Mouth: Mucous membranes are moist.     Pharynx: Oropharynx is clear. Uvula midline. Posterior oropharyngeal erythema present.     Tonsils: No tonsillar exudate or tonsillar abscesses.  Eyes:     Conjunctiva/sclera: Conjunctivae normal.     Pupils: Pupils are equal, round, and reactive to light.  Cardiovascular:     Rate and Rhythm: Normal rate and regular rhythm.     Heart sounds: Normal heart sounds.  Pulmonary:     Effort: Pulmonary effort is normal.     Breath sounds: Normal breath sounds. No wheezing, rhonchi or rales.  Musculoskeletal:     Cervical back: Normal range of motion and neck supple.  Lymphadenopathy:     Cervical: No cervical adenopathy.  Skin:    General: Skin is warm and dry.  Neurological:     General: No focal deficit present.     Mental Status: She is alert and oriented to person, place, and time.  Psychiatric:         Mood and Affect: Mood normal.        Behavior: Behavior normal.      UC Treatments / Results  Labs (all labs ordered are listed, but only abnormal results are displayed) Labs Reviewed - No data to display   Comp Met (CMET) Order: [id number]  Status: Final result     Next appt: 07/06/2024 at 09:00 AM in Family Medicine (Eleanor GORMAN Ponto, NP)     Dx: Mixed hyperlipidemia   Test Result Released: Yes (seen)     Messages: Seen   0 Result Notes     1 Patient Communication     View Follow-Up Encounter          Component Ref Range & Units (hover) 4 mo ago (01/06/24) 7 mo ago (10/15/23) 1 yr ago (03/10/23) 1 yr ago (06/30/22) 1 yr ago (06/29/22) 10 yr ago (05/24/14) 10 yr ago (02/17/14)  Sodium 142 142 R 140 139 R 139 R 137   Potassium 4.5 4.9 R 4.1 3.8 R 4.3 R 4.3 R   Chloride 107 105 R 106 108 R 108 R 104   CO2 30 22 R 27 24 R 24 R 24   Glucose, Bld 110 High  89 97 92 CM 118 High  CM 100 High    BUN 14 19 R 14 12 R  16 R 13   Creatinine, Ser 0.77 0.87 R 0.74 0.78 R 0.92 R 0.71 R   Total Bilirubin 0.6  0.7  0.7 R 0.4 0.5  Alkaline Phosphatase 57  73  75 R 71 77  AST 19  17  22  R 21 24  ALT 19  18  25  R 31 29  Total Protein 6.5  6.4  6.5 R 6.6 6.4  Albumin 4.5  4.2  4.2 R 4.7 4.3  GFR 74.96  79.08 CM      Comment: Calculated using the CKD-EPI Creatinine Equation (2021)  Calcium  10.0 9.9 R 10.0 9.1 R 9.9 R 9.9   Resulting Agency [facility] [place] [facility]        Specimen Collected: 01/06/24 08:48 Last Resulted: 01/06/24 15:10   EKG   Radiology DG Chest 2 View Result Date: 05/22/2024 CLINICAL DATA:  Ten day history of cough and fever EXAM: CHEST - 2 VIEW COMPARISON:  Cardiac CT dated 09/05/2022, CTA chest dated 06/29/2022 chest radiograph dated 06/29/2022 FINDINGS: Normal lung volumes. Small focus of rounded opacity projecting over the lateral right lower lung. No pleural effusion or pneumothorax. The heart size and  mediastinal contours are within normal limits. No acute osseous abnormality. IMPRESSION: Small focus of rounded opacity projecting over the lateral right lower lung, which corresponds to site of atelectasis/scarring on prior CT. Superimposed pneumonia can be considered in the setting of fever and cough. Electronically Signed   By: Limin  Xu M.D.   On: 05/22/2024 15:21    Procedures Procedures (including critical care time)  Medications Ordered in UC Medications - No data to display  Initial Impression / Assessment and Plan / UC Course  I have reviewed the triage vital signs and the nursing notes.  Pertinent labs & imaging results that were available during my care of the patient were reviewed by me and considered in my medical decision making (see chart for details).  Clinical Course as of 05/22/24 1537  Sun May 22, 2024  1517 O2 recheck 95% on room air [JM]    Clinical Course User Index [JM] Loreda Myla SAUNDERS, NP    Reviewed exam and symptoms with patient.  No red flags.  Wet read of x-ray without obvious consolidation, will contact for any positive results based on radiology overread once available.  Discussed sinobronchitis, start Augmentin .  Patient requested Bromfed for cough syrup stating Tessalon  does not help her.  Discussed rest fluids and continuation of Flonase .  Nasal rinses as tolerated.  PCP follow-up 2 to 3 days for recheck.  ER precautions reviewed and patient verbalized understanding.  Addendum 1537: Radiology overread shows rounded opacity consistent with previous atelectasis scarring but cannot rule out superimposed pneumonia, given this we will add on azithromycin .  Contacted patient and informed and Rx sent to pharmacy.  No other changes in plan of care.  Patient verbalized understanding Final Clinical Impressions(s) / UC Diagnoses   Final diagnoses:  Acute cough  Sinobronchitis     Discharge Instructions      Start Augmentin  twice daily for 10 days.  Bromfed  twice daily as needed for your cough.  You may do nasal rinses as tolerated and continue Flonase .  Lots of rest and fluids.  Please follow-up with your PCP in 2 to 3 days for recheck.  Please go to the ER if you develop any worsening symptoms.  Hope you feel better soon!     ED Prescriptions  Medication Sig Dispense Auth. Provider   amoxicillin -clavulanate (AUGMENTIN ) 875-125 MG tablet Take 1 tablet by mouth every 12 (twelve) hours for 10 days. 20 tablet Sheba Whaling, Jodi R, NP   brompheniramine-pseudoephedrine-DM 30-2-10 MG/5ML syrup Take 5 mLs by mouth 2 (two) times daily as needed for up to 5 days. 50 mL Jaevin Medearis, Jodi R, NP   azithromycin  (ZITHROMAX ) 250 MG tablet Take 1 tablet (250 mg total) by mouth daily. Take first 2 tablets together, then 1 every day until finished. 6 tablet Hartlee Amedee, Jodi R, NP      PDMP not reviewed this encounter.   Loreda Myla SAUNDERS, NP 05/22/24 1518    Loreda Myla SAUNDERS, NP 05/22/24 1538

## 2024-05-27 ENCOUNTER — Ambulatory Visit (INDEPENDENT_AMBULATORY_CARE_PROVIDER_SITE_OTHER): Admitting: Family

## 2024-05-27 VITALS — BP 123/59 | HR 89 | Temp 98.1°F | Resp 16 | Ht 70.0 in | Wt 204.0 lb

## 2024-05-27 DIAGNOSIS — J189 Pneumonia, unspecified organism: Secondary | ICD-10-CM

## 2024-05-27 MED ORDER — BENZONATATE 100 MG PO CAPS: 100.0000 mg | ORAL_CAPSULE | Freq: Three times a day (TID) | ORAL | 0 refills | Status: DC | PRN

## 2024-05-27 MED ORDER — PREDNISONE 10 MG PO TABS: ORAL_TABLET | ORAL | 0 refills | Status: DC

## 2024-05-27 MED ORDER — ALBUTEROL SULFATE HFA 108 (90 BASE) MCG/ACT IN AERS: 2.0000 | INHALATION_SPRAY | Freq: Four times a day (QID) | RESPIRATORY_TRACT | 0 refills | Status: AC | PRN

## 2024-05-27 NOTE — Patient Instructions (Addendum)
 VISIT SUMMARY:  Today, we addressed your persistent cough and respiratory symptoms, which have been ongoing for about two weeks. You were diagnosed with right lower lobe pneumonia and given a treatment plan to help manage your symptoms and promote recovery.  YOUR PLAN:  RIGHT LOWER LOBE PNEUMONIA WITH PERSISTENT COUGH: You have a persistent cough and right lower lobe pneumonia, along with recent fever and fatigue. -Continue taking Augmentin  for the next 5 days. -Use the albuterol  inhaler, 2 puffs every 6 hours as needed for relief. -Take prednisone  as prescribed to reduce inflammation. -Take Tessalon  capsules as prescribed to help suppress your cough. -We will repeat your chest x-ray at next month's appointment to ensure the pneumonia has resolved.

## 2024-05-27 NOTE — Assessment & Plan Note (Signed)
  Persistent cough with right lower lobe pneumonia, recent fever, and fatigue. Albuterol  effective for symptom relief.  - Continue Augmentin  for 5 more days.  - Prescribe albuterol  inhaler, 2 puffs every 6 hours as needed.  - Prescribe prednisone  for inflammation.  - Prescribe Tessalon  capsules for cough suppression.  - Repeat chest x-ray at next month's appointment to ensure resolution.

## 2024-05-27 NOTE — Progress Notes (Signed)
 Subjective:     Patient ID: Kelly Burns, female    DOB: [birth year], 78 y.o.   MRN: [id number]  Chief Complaint  Patient presents with   Pneumonia    Diagnosed 9/07 with pheumonia    Pneumonia    Discussed the use of AI scribe software for clinical note transcription with the patient, who gave verbal consent to proceed.  History of Present Illness Kelly Burns is a 78 year old female who presents with persistent cough and respiratory symptoms. Her symptoms began approximately two weeks ago, initially resembling a cold. Her temperature reached 100.70F, and she experienced significant fatigue. She went to urgent care on 9/7 and completed CXR which noted the following:   IMPRESSION: Small focus of rounded opacity projecting over the lateral right lower lung, which corresponds to site of atelectasis/scarring on prior CT. Superimposed pneumonia can be considered in the setting of fever and cough.  She was prescribed azithromycin  and Augmentin  at urgent care. She completed azithromycin  and has five days of Augmentin  remaining. Her cough, initially productive, persists and is exhausting. She used a nebulizer with albuterol , which provided relief. She describes respiratory issues as her 'Achilles heel,' with symptoms often affecting her lungs. She wears a mask to protect her immune system and has been sleeping sitting up due to the cough. Her medical history includes a cholesteatoma and mastoidectomy, preventing hearing aid use on one side. She is claustrophobic and avoids CPAP therapy.       Health Maintenance Due  Topic Date Due   Zoster Vaccines- Shingrix (1 of 2) Never done   COVID-19 Vaccine (2 - Pfizer risk series) 07/07/2023   Influenza Vaccine  04/15/2024   DTaP/Tdap/Td (2 - Td or Tdap) 05/24/2024   Medicare Annual Wellness (AWV)  06/08/2024    Past Medical History:  Diagnosis Date   Adhesive capsulitis of left shoulder 05/11/2013   Arthritis    Cataract     Community acquired pneumonia of right lower lobe of lung 06/29/2022   Deafness in right ear    Diverticulitis    one episode   fibroadenoma right breast 05/11/2013   See U/S 03/2013   Hyperlipidemia    IBS (irritable bowel syndrome)    urgency   PAF (paroxysmal atrial fibrillation) (HCC)    Squamous cell skin cancer 11/04/2023   right arm skin cancer removal   TIA (transient ischemic attack)    Vertigo     Past Surgical History:  Procedure Laterality Date   APPENDECTOMY     BLADDER SUSPENSION  2020   BREAST BIOPSY Right    reports that it was a cyst   CHOLECYSTECTOMY     MIDDLE EAR SURGERY     skin cancer removal  2025   Right arm (? MOH's)   TONSILLECTOMY      Family History  Problem Relation Age of Onset   Heart disease Mother    Dementia Father    Cancer Maternal Grandmother        breast   Breast cancer Maternal Grandmother    Cancer Maternal Aunt        colon   Breast cancer Cousin     Social History   Socioeconomic History   Marital status: Widowed    Spouse name: Not on file   Number of children: 3   Years of education: Not on file   Highest education level: Some college, no degree  Occupational History   Not on file  Tobacco Use  Smoking status: Former    Current packs/day: 0.00    Types: Cigarettes    Quit date: 09/15/2002    Years since quitting: 21.7   Smokeless tobacco: Never  Vaping Use   Vaping status: Never Used  Substance and Sexual Activity   Alcohol use: Yes    Comment: social   Drug use: No   Sexual activity: Not Currently  Other Topics Concern   Not on file  Social History Narrative   Lives at home alone    Widowed (husband died 2013/06/06)   Right handed   Caffeine: decaf coffee 0-2 cups/day   1 adopted son   1 biological son   1 biological daughter   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    Social Drivers of Corporate investment banker Strain: Low Risk  (05/26/2024)    Overall Financial Resource Strain (CARDIA)    Difficulty of Paying Living Expenses: Not hard at all  Food Insecurity: No Food Insecurity (05/26/2024)   Hunger Vital Sign    Worried About Running Out of Food in the Last Year: Never true    Ran Out of Food in the Last Year: Never true  Transportation Needs: No Transportation Needs (05/26/2024)   PRAPARE - Administrator, Civil Service (Medical): No    Lack of Transportation (Non-Medical): No  Physical Activity: Inactive (05/26/2024)   Exercise Vital Sign    Days of Exercise per Week: 0 days    Minutes of Exercise per Session: Not on file  Stress: No Stress Concern Present (05/26/2024)   [employer] of Occupational Health - Occupational Stress Questionnaire    Feeling of Stress: Only a little  Social Connections: Moderately Integrated (05/26/2024)   Social Connection and Isolation Panel    Frequency of Communication with Friends and Family: More than three times a week    Frequency of Social Gatherings with Friends and Family: Patient declined    Attends Religious Services: More than 4 times per year    Active Member of [employer] or Organizations: Yes    Attends Banker Meetings: More than 4 times per year    Marital Status: Widowed  Intimate Partner Violence: Unknown (11/09/2022)   Received from [facility]   HITS    Physically Hurt: Not on file    Insult or Talk Down To: Not on file    Threaten Physical Harm: Not on file    Scream or Curse: Not on file    Outpatient Medications Prior to Visit  Medication Sig Dispense Refill   alendronate  (FOSAMAX ) 70 MG tablet Take 1 tablet (70 mg total) by mouth every 7 (seven) days. Take with a full glass of water on an empty stomach. 4 tablet 11   amoxicillin -clavulanate (AUGMENTIN ) 875-125 MG tablet Take 1 tablet by mouth every 12 (twelve) hours for 10 days. 20 tablet 0   apixaban  (ELIQUIS ) 5 MG TABS tablet Take 1 tablet by mouth twice daily 60 tablet 5    azithromycin  (ZITHROMAX ) 250 MG tablet Take 1 tablet (250 mg total) by mouth daily. Take first 2 tablets together, then 1 every day until finished. 6 tablet 0   brompheniramine-pseudoephedrine-DM 30-2-10 MG/5ML syrup Take 5 mLs by mouth 2 (two) times daily as needed for up to 5 days. 50 mL 0   calcium -vitamin D  (OSCAL WITH D) 500-5 MG-MCG tablet Take 1 tablet by mouth daily with breakfast.     fluticasone  (FLONASE  ALLERGY  RELIEF) 50 MCG/ACT nasal spray Place 2 sprays into both nostrils daily. 16 g 5   loratadine  (CLARITIN ) 10 MG tablet Take 1 tablet (10 mg total) by mouth daily. 90 tablet 4   metoprolol  tartrate (LOPRESSOR ) 25 MG tablet Take 1 tablet (25 mg total) by mouth as needed for up to 1 dose (MAY TAKE UP TO TWICE-A-DAY AS NEEDED FOR SUSTAINED PALP LASTING > 10 MIN.). 30 tablet 1   montelukast  (SINGULAIR ) 10 MG tablet Take 1 tablet (10 mg total) by mouth at bedtime. 90 tablet 1   Multiple Vitamin (MULTIVITAMIN) tablet Take 1 tablet by mouth daily.     PREMARIN vaginal cream Place 1 g vaginally once a week.     Probiotic Product (ALIGN PO) Take 1 capsule by mouth every other day.     rosuvastatin  (CRESTOR ) 10 MG tablet Take 1 tablet (10 mg total) by mouth daily. 90 tablet 1   No facility-administered medications prior to visit.    Allergies  Allergen Reactions   Bactrim [Sulfamethoxazole-Trimethoprim] Rash    Rash broke out on stomach   Other Itching and Rash    CSF-HC powder applied to the mastoid cavity results in severe itching and ear drainage.    ROS See HPI    Objective:    Physical Exam Constitutional:      General: She is not in acute distress.    Appearance: Normal appearance. She is well-developed.  HENT:     Head: Normocephalic and atraumatic.     Right Ear: External ear normal.     Left Ear: Tympanic membrane, ear canal and external ear normal.     Ears:     Comments: R TM, abnormal from previous surgery Eyes:     General: No scleral icterus. Neck:      Thyroid : No thyromegaly.  Cardiovascular:     Rate and Rhythm: Normal rate and regular rhythm.     Heart sounds: Normal heart sounds. No murmur heard. Pulmonary:     Effort: Pulmonary effort is normal. No respiratory distress.     Breath sounds: Examination of the right-lower field reveals rales. Rales present. No wheezing.  Musculoskeletal:     Cervical back: Neck supple.  Skin:    General: Skin is warm and dry.  Neurological:     Mental Status: She is alert and oriented to person, place, and time.  Psychiatric:        Mood and Affect: Mood normal.        Behavior: Behavior normal.        Thought Content: Thought content normal.        Judgment: Judgment normal.      BP (!) 123/59 (BP Location: Right Arm, Patient Position: Sitting, Cuff Size: Large)   Pulse 89   Temp 98.1 F (36.7 C) (Oral)   Resp 16   Ht 5' 10 (1.778 m)   Wt 204 lb (92.5 kg)   SpO2 96%   BMI 29.27 kg/m  Wt Readings from Last 3 Encounters:  05/27/24 204 lb (92.5 kg)  01/06/24 204 lb (92.5 kg)  01/05/24 205 lb (93 kg)       Assessment & Plan:   Problem List Items Addressed This Visit       Unprioritized   Community acquired pneumonia of right lower lobe of lung - Primary    Persistent cough with right lower lobe pneumonia, recent fever, and fatigue. Albuterol  effective for symptom relief.  - Continue Augmentin  for 5 more days.  -  Prescribe albuterol  inhaler, 2 puffs every 6 hours as needed.  - Prescribe prednisone  for inflammation.  - Prescribe Tessalon  capsules for cough suppression.  - Repeat chest x-ray at next month's appointment to ensure resolution.       Relevant Medications   albuterol  (VENTOLIN  HFA) 108 (90 Base) MCG/ACT inhaler   predniSONE  (DELTASONE ) 10 MG tablet   benzonatate  (TESSALON ) 100 MG capsule    I am having Kelly Burns start on albuterol , predniSONE , and benzonatate . I am also having her maintain her Probiotic Product (ALIGN PO), multivitamin, Premarin,  calcium -vitamin D , loratadine , fluticasone , alendronate , metoprolol  tartrate, montelukast , rosuvastatin , Eliquis , amoxicillin -clavulanate, brompheniramine-pseudoephedrine-DM, and azithromycin .  Meds ordered this encounter  Medications   albuterol  (VENTOLIN  HFA) 108 (90 Base) MCG/ACT inhaler    Sig: Inhale 2 puffs into the lungs every 6 (six) hours as needed for wheezing or shortness of breath.    Dispense:  8 g    Refill:  0    Supervising Provider:   DOMENICA BLACKBIRD A [[id number]]   predniSONE  (DELTASONE ) 10 MG tablet    Sig: 4 tabs by mouth once daily for 2 days, then 3 tabs daily x 2 days, then 2 tabs daily x 2 days, then 1 tab daily x 2 days    Dispense:  20 tablet    Refill:  0    Supervising Provider:   DOMENICA BLACKBIRD A [[id number]]   benzonatate  (TESSALON ) 100 MG capsule    Sig: Take 1 capsule (100 mg total) by mouth 3 (three) times daily as needed.    Dispense:  20 capsule    Refill:  0    Supervising Provider:   DOMENICA BLACKBIRD A [[id number]]

## 2024-06-22 ENCOUNTER — Ambulatory Visit: Payer: Self-pay | Admitting: *Deleted

## 2024-06-22 NOTE — Progress Notes (Unsigned)
 No chief complaint on file.   Kelly Burns here for URI complaints.Patient is in today for lower back pain and dysuria.  She follows with urology ***???? and currently is on Cipro .  She started taking Cipro  on Sunday.    Duration: {Numbers; 1-10:13787} {Time; day/wk/mo/yr:20843}  Associated symptoms: {URI Symptoms :210800001} Denies: {URI Symptoms :210800001} Treatment to date: *** Sick contacts: {yes/no:20286}  Past Medical History:  Diagnosis Date   Adhesive capsulitis of left shoulder 05/11/2013   Arthritis    Cataract    Community acquired pneumonia of right lower lobe of lung 06/29/2022   Deafness in right ear    Diverticulitis    one episode   fibroadenoma right breast 05/11/2013   See U/S 03/2013   Hyperlipidemia    IBS (irritable bowel syndrome)    urgency   PAF (paroxysmal atrial fibrillation) (HCC)    Squamous cell skin cancer 11/04/2023   right arm skin cancer removal   TIA (transient ischemic attack)    Vertigo     Objective There were no vitals taken for this visit. General: Awake, alert, appears stated age HEENT: AT, [place], ears patent b/l and TM's neg, nares patent w/o discharge, pharynx pink and without exudates, MMM Neck: No masses or asymmetry Heart: RRR Lungs: CTAB, no accessory muscle use Psych: Age appropriate judgment and insight, normal mood and affect  No diagnosis found.  Continue to push fluids, practice good hand hygiene, cover mouth when coughing. F/u prn. If starting to experience fevers, shaking, or shortness of breath, seek immediate care. Pt voiced understanding and agreement to the plan.  Harlene LITTIE Jolly, DNP, AGNP-C 06/22/24 1:03 PM

## 2024-06-22 NOTE — Telephone Encounter (Signed)
 FYI Only or Action Required?: FYI only for provider.  Patient was last seen in primary care on 05/27/2024 by Daryl Setter, NP.  Called Nurse Triage reporting Dysuria.  Symptoms began several days ago.  Interventions attempted: Prescription medications: Cipro .  Symptoms are: unchanged.  Triage Disposition: See HCP Within 4 Hours (Or PCP Triage)  Patient/caregiver understands and will follow disposition?: Declines appointment today- requesting am appointment- patient has been scheduled- advised ED for fever, blood in urine, changes in symptoms.  Reason for Disposition  [1] Taking antibiotic > 24 hours for UTI AND [2] flank or lower back pain getting WORSE  Answer Assessment - Initial Assessment Questions 1. SEVERITY: How bad is the pain?  (e.g., Scale 1-10; mild, moderate, or severe)     Mild- more lower back pain  4. ONSET: When did the painful urination start?      Saturday night 5. FEVER: Do you have a fever? If Yes, ask: What is your temperature, how was it measured, and when did it start?     no 6. PAST UTI: Have you had a urine infection before? If Yes, ask: When was the last time? and What happened that time?      Patient has on hand antibiotic from urologist- not helping 7. CAUSE: What do you think is causing the painful urination?  (e.g., UTI, scratch, Herpes sore)     UTI 8. OTHER SYMPTOMS: Do you have any other symptoms? (e.g., blood in urine, flank pain, genital sores, urgency, vaginal discharge)     Lower back pain, fatigue  Answer Assessment - Initial Assessment Questions 1. MAIN SYMPTOM: What is the main symptom you are concerned about? (e.g., painful urination, urine frequency)     Lower back pain 2. BETTER-SAME-WORSE: Are you getting better, staying the same, or getting worse compared to how you felt at your last visit to the doctor (most recent medical visit)?     worse 3. PAIN: How bad is the pain?  (e.g., Scale 1-10; mild,  moderate, or severe)     Lower back 4. FEVER: Do you have a fever? If Yes, ask: What is it, how was it measured, and when did it start?     no 5. OTHER SYMPTOMS: Do you have any other symptoms? (e.g., blood in the urine, flank pain, vaginal discharge)     Flank pain, fatigue 6. DIAGNOSIS: When was the UTI diagnosed? By whom? Was it a kidney infection, bladder infection or both?     Not diagnosed- chronic- on  hand antibiotic  7. ANTIBIOTIC: What antibiotic(s) are you taking? How many times per day?     Cipro  8. ANTIBIOTIC - START DATE: When did you start taking the antibiotic?     Sunday  Protocols used: Urination Pain - Female-A-AH, Urinary Tract Infection on Antibiotic Follow-up Call - [facility]   Copied from CRM [id number]. Topic: Clinical - Red Word Triage >> Jun 22, 2024 11:03 AM Robinson DEL wrote: [facility] that prompted transfer to Nurse Triage: UTI, burning and pain, fatigue, achy

## 2024-06-23 ENCOUNTER — Encounter: Payer: Self-pay | Admitting: Student

## 2024-06-23 ENCOUNTER — Ambulatory Visit (INDEPENDENT_AMBULATORY_CARE_PROVIDER_SITE_OTHER): Admitting: Student

## 2024-06-23 ENCOUNTER — Other Ambulatory Visit (HOSPITAL_COMMUNITY)
Admission: RE | Admit: 2024-06-23 | Discharge: 2024-06-23 | Disposition: A | Discharge status: Home / Self Care | Source: Ambulatory Visit | Attending: Student | Admitting: Student

## 2024-06-23 VITALS — BP 127/77 | HR 82 | Temp 97.6°F | Resp 18 | Ht 70.0 in | Wt 206.0 lb

## 2024-06-23 DIAGNOSIS — R300 Dysuria: Secondary | ICD-10-CM | POA: Diagnosis not present

## 2024-06-23 LAB — URINALYSIS, ROUTINE W REFLEX MICROSCOPIC
Bilirubin Urine: NEGATIVE
Ketones, ur: NEGATIVE
Nitrite: NEGATIVE
Specific Gravity, Urine: 1.02 (ref 1.000–1.030)
Total Protein, Urine: NEGATIVE
Urine Glucose: NEGATIVE
Urobilinogen, UA: 0.2 (ref 0.0–1.0)
pH: 6 (ref 5.0–8.0)

## 2024-06-23 LAB — POC URINALSYSI DIPSTICK (AUTOMATED)
Bilirubin, UA: NEGATIVE
Blood, UA: NEGATIVE
Glucose, UA: NEGATIVE
Ketones, UA: NEGATIVE
Nitrite, UA: NEGATIVE
Protein, UA: NEGATIVE
Spec Grav, UA: 1.01 (ref 1.010–1.025)
Urobilinogen, UA: 0.2 U/dL
pH, UA: 5 (ref 5.0–8.0)

## 2024-06-23 MED ORDER — NITROFURANTOIN MONOHYD MACRO 100 MG PO CAPS: 100.0000 mg | ORAL_CAPSULE | Freq: Two times a day (BID) | ORAL | 0 refills | Status: AC

## 2024-06-23 MED ORDER — FLUCONAZOLE 150 MG PO TABS: 150.0000 mg | ORAL_TABLET | Freq: Every day | ORAL | 0 refills | Status: DC

## 2024-06-23 NOTE — Progress Notes (Signed)
 Chief Complaint  Patient presents with   Flank Pain    X4 days, lower back pain/flank pain, taking left over cipro  from urology (given to her in May 2025 for her trip to [place] in case she needed), see's them q72m, denies fever, chills, +some nausea     Kelly Burns is a 78 y.o. female here for possible UTI.  Hx recurrent urinary tract infections.She experiences dysuria and darker urine without hematuria. She has been taking Cipro  for five days with no improvement. Significant fatigue, and a burning sensation w urination persist. No fever is present. She feels bloated and experiences itching without discharge. Her current medications include Cipro  and Azo. She has been on many antibiotics over the past few weeks r/t PNA, UTI.   Patient denies fever, chills, SOB, CP, palpitations, dyspnea, edema, HA, vision changes, N/V/D, abdominal pain, rash, weight changes.  Duration: 5 days. Symptoms: Dysuria, urinary frequency and dysuria Denies: hematuria, urinary hesitancy, urinary retention, fever, chills, sweats, nausea, and vomiting, vaginal discharge Hx of recurrent UTI? Yes Denies new sexual partners.  Past Medical History:  Diagnosis Date   Adhesive capsulitis of left shoulder 05/11/2013   Arthritis    Cataract    Community acquired pneumonia of right lower lobe of lung 06/29/2022   Deafness in right ear    Diverticulitis    one episode   fibroadenoma right breast 05/11/2013   See U/S 03/2013   Hyperlipidemia    IBS (irritable bowel syndrome)    urgency   PAF (paroxysmal atrial fibrillation) (HCC)    Squamous cell skin cancer 11/04/2023   right arm skin cancer removal   TIA (transient ischemic attack)    Vertigo      BP 127/77   Pulse 82   Temp 97.6 F (36.4 C) (Oral)   Resp 18   Ht 5' 10 (1.778 m)   Wt 206 lb (93.4 kg)   SpO2 97%   BMI 29.56 kg/m  General: Awake, alert, appears stated age Heart: RRR Lungs: CTAB, normal respiratory effort, no accessory muscle  usage Abd: BS+, soft, NT, ND, no masses or organomegaly MSK: No CVA tenderness,  Psych: Age appropriate judgment and insight  Dysuria - Plan: nitrofurantoin, macrocrystal-monohydrate, (MACROBID) 100 MG capsule, fluconazole (DIFLUCAN) 150 MG tablet, Urine Culture, Cervicovaginal ancillary only( [place]), POCT Urinalysis Dipstick (Automated), Urinalysis, Routine w reflex microscopic, CANCELED: Urinalysis, Routine w reflex microscopic  Dysuria x 5 days despite Cipro  abx. Cipro  treatment ineffective, suggesting resistance or inappropriate choice. - Order urine dipstick and culture. - Discontinue Cipro . - Initiate Macrobid (nitrofurantoin) 100 mg BID for 5 days. - Encourage increased water intake. - Advise daily use of Azo cranberry for prevention. - Consider earlier urology follow-up if symptoms persist  Possible vaginal candidiasis Possible yeast infection due to recent antibiotic use, presenting with itching and burning. - Order cervical swab - Imperially Tx- Rx-Diflucan    Stay hydrated. Seek immediate care if pt starts to develop fevers, new/worsening symptoms, uncontrollable N/V. F/u prn. The patient voiced understanding and agreement to the plan.  Harlene LITTIE Jolly, DNP, AGNP-C 06/23/24 10:43 AM

## 2024-06-24 LAB — CERVICOVAGINAL ANCILLARY ONLY
Bacterial Vaginitis (gardnerella): NEGATIVE
Candida Glabrata: NEGATIVE
Candida Vaginitis: POSITIVE — AB
Chlamydia: NEGATIVE
Comment: NEGATIVE
Comment: NEGATIVE
Comment: NEGATIVE
Comment: NEGATIVE
Comment: NEGATIVE
Comment: NORMAL
Neisseria Gonorrhea: NEGATIVE
Trichomonas: NEGATIVE

## 2024-06-26 LAB — URINE CULTURE
MICRO NUMBER:: 17078743
SPECIMEN QUALITY:: ADEQUATE

## 2024-06-27 ENCOUNTER — Ambulatory Visit: Payer: Self-pay | Admitting: Student

## 2024-07-01 ENCOUNTER — Other Ambulatory Visit: Payer: Self-pay | Admitting: Family

## 2024-07-01 DIAGNOSIS — N390 Urinary tract infection, site not specified: Secondary | ICD-10-CM | POA: Diagnosis not present

## 2024-07-01 DIAGNOSIS — J309 Allergic rhinitis, unspecified: Secondary | ICD-10-CM

## 2024-07-02 ENCOUNTER — Other Ambulatory Visit: Payer: Self-pay | Admitting: Family

## 2024-07-02 DIAGNOSIS — E782 Mixed hyperlipidemia: Secondary | ICD-10-CM

## 2024-07-05 ENCOUNTER — Ambulatory Visit (INDEPENDENT_AMBULATORY_CARE_PROVIDER_SITE_OTHER)

## 2024-07-05 VITALS — Ht 70.0 in | Wt 206.0 lb

## 2024-07-05 DIAGNOSIS — Z0000 Encounter for general adult medical examination without abnormal findings: Secondary | ICD-10-CM | POA: Diagnosis not present

## 2024-07-05 NOTE — Progress Notes (Signed)
 Subjective:   Kelly Burns is a 78 y.o. who presents for a Medicare Wellness preventive visit.  As a reminder, Annual Wellness Visits don't include a physical exam, and some assessments may be limited, especially if this visit is performed virtually. We may recommend an in-person follow-up visit with your provider if needed.  Visit Complete: Virtual I connected with  [name] on 07/05/24 by a audio enabled telemedicine application and verified that I am speaking with the correct person using two identifiers.  Patient Location: Skilled Nursing Facility  Provider Location: Home Office  I discussed the limitations of evaluation and management by telemedicine. The patient expressed understanding and agreed to proceed.  Vital Signs: Because this visit was a virtual/telehealth visit, some criteria may be missing or patient reported. Any vitals not documented were not able to be obtained and vitals that have been documented are patient reported.    Persons Participating in Visit: Patient.  AWV Questionnaire: No: Patient Medicare AWV questionnaire was not completed prior to this visit.  Cardiac Risk Factors include: advanced age (>34men, >16 women)     Objective:    Today's Vitals   07/05/24 1600  Weight: 206 lb (93.4 kg)  Height: 5' 10 (1.778 m)   Body mass index is 29.56 kg/m.     07/05/2024    4:09 PM  Advanced Directives  Does Patient Have a Medical Advance Directive? Yes  Type of Estate agent of [place];Living will  Copy of Healthcare Power of Attorney in Chart? No - copy requested    Current Medications (verified) Outpatient Encounter Medications as of 07/05/2024  Medication Sig   albuterol  (VENTOLIN  HFA) 108 (90 Base) MCG/ACT inhaler Inhale 2 puffs into the lungs every 6 (six) hours as needed for wheezing or shortness of breath.   alendronate  (FOSAMAX ) 70 MG tablet Take 1 tablet (70 mg total) by mouth every 7 (seven) days. Take  with a full glass of water on an empty stomach.   apixaban  (ELIQUIS ) 5 MG TABS tablet Take 1 tablet by mouth twice daily   azithromycin  (ZITHROMAX ) 250 MG tablet Take 1 tablet (250 mg total) by mouth daily. Take first 2 tablets together, then 1 every day until finished. (Patient not taking: Reported on 06/23/2024)   benzonatate  (TESSALON ) 100 MG capsule Take 1 capsule (100 mg total) by mouth 3 (three) times daily as needed. (Patient not taking: Reported on 06/23/2024)   calcium -vitamin D  (OSCAL WITH D) 500-5 MG-MCG tablet Take 1 tablet by mouth daily with breakfast.   ciprofloxacin  (CIPRO ) 500 MG tablet Take 500 mg by mouth 2 (two) times daily.   fluconazole (DIFLUCAN) 150 MG tablet Take 1 tablet (150 mg total) by mouth daily. May repeat in 3 days if needed.   fluticasone  (FLONASE  ALLERGY RELIEF) 50 MCG/ACT nasal spray Place 2 sprays into both nostrils daily.   loratadine  (CLARITIN ) 10 MG tablet Take 1 tablet (10 mg total) by mouth daily. (Patient not taking: Reported on 06/23/2024)   metoprolol  tartrate (LOPRESSOR ) 25 MG tablet Take 1 tablet (25 mg total) by mouth as needed for up to 1 dose (MAY TAKE UP TO TWICE-A-DAY AS NEEDED FOR SUSTAINED PALP LASTING > 10 MIN.).   montelukast  (SINGULAIR ) 10 MG tablet TAKE 1 TABLET BY MOUTH AT BEDTIME   Multiple Vitamin (MULTIVITAMIN) tablet Take 1 tablet by mouth daily.   predniSONE  (DELTASONE ) 10 MG tablet 4 tabs by mouth once daily for 2 days, then 3 tabs daily x 2 days, then 2  tabs daily x 2 days, then 1 tab daily x 2 days (Patient not taking: Reported on 06/23/2024)   PREMARIN vaginal cream Place 1 g vaginally once a week.   Probiotic Product (ALIGN PO) Take 1 capsule by mouth every other day.   rosuvastatin  (CRESTOR ) 10 MG tablet Take 1 tablet by mouth once daily   No facility-administered encounter medications on file as of 07/05/2024.    Allergies (verified) Bactrim [sulfamethoxazole-trimethoprim] and Other   History: Past Medical History:  Diagnosis  Date   Adhesive capsulitis of left shoulder 05/11/2013   Arthritis    Cataract    Community acquired pneumonia of right lower lobe of lung 06/29/2022   Deafness in right ear    Diverticulitis    one episode   fibroadenoma right breast 05/11/2013   See U/S 03/2013   Hyperlipidemia    IBS (irritable bowel syndrome)    urgency   PAF (paroxysmal atrial fibrillation) (HCC)    Squamous cell skin cancer 11/04/2023   right arm skin cancer removal   TIA (transient ischemic attack)    Vertigo    Past Surgical History:  Procedure Laterality Date   APPENDECTOMY     BLADDER SUSPENSION  29-Jul-2019   BREAST BIOPSY Right    reports that it was a cyst   CHOLECYSTECTOMY     MIDDLE EAR SURGERY     skin cancer removal  2025   Right arm (? MOH's)   TONSILLECTOMY     Family History  Problem Relation Age of Onset   Heart disease Mother    Dementia Father    Cancer Maternal Grandmother        breast   Breast cancer Maternal Grandmother    Cancer Maternal Aunt        colon   Breast cancer Cousin    Social History   Socioeconomic History   Marital status: Widowed    Spouse name: Not on file   Number of children: 3   Years of education: Not on file   Highest education level: Some college, no degree  Occupational History   Not on file  Tobacco Use   Smoking status: Former    Current packs/day: 0.00    Types: Cigarettes    Quit date: 09/15/2002    Years since quitting: 21.8   Smokeless tobacco: Never  Vaping Use   Vaping status: Never Used  Substance and Sexual Activity   Alcohol use: Yes    Comment: social   Drug use: No   Sexual activity: Not Currently  Other Topics Concern   Not on file  Social History Narrative   Lives at home alone    Widowed (husband died July 28, 2013)   Right handed   Caffeine: decaf coffee 0-2 cups/day   1 adopted son   1 biological son   1 biological daughter   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     Social Drivers of Corporate investment banker Strain: Low Risk  (07/05/2024)   Overall Financial Resource Strain (CARDIA)    Difficulty of Paying Living Expenses: Not hard at all  Food Insecurity: No Food Insecurity (07/05/2024)   Hunger Vital Sign    Worried About Running Out of Food in the Last Year: Never true    Ran Out of Food in the Last Year: Never true  Transportation Needs: No Transportation Needs (07/05/2024)   PRAPARE - Administrator, Civil Service (Medical):  No    Lack of Transportation (Non-Medical): No  Physical Activity: Inactive (07/05/2024)   Exercise Vital Sign    Days of Exercise per Week: 0 days    Minutes of Exercise per Session: 0 min  Stress: No Stress Concern Present (07/05/2024)   [employer] of Occupational Health - Occupational Stress Questionnaire    Feeling of Stress: Not at all  Social Connections: Moderately Integrated (07/05/2024)   Social Connection and Isolation Panel    Frequency of Communication with Friends and Family: More than three times a week    Frequency of Social Gatherings with Friends and Family: More than three times a week    Attends Religious Services: More than 4 times per year    Active Member of [employer] or Organizations: Yes    Attends Banker Meetings: More than 4 times per year    Marital Status: Widowed    Tobacco Counseling Counseling given: Not Answered    Clinical Intake:  Pre-visit preparation completed: Yes  Pain : No/denies pain     BMI - recorded: 29.56 Nutritional Status: BMI 25 -29 Overweight Nutritional Risks: None Diabetes: No  Lab Results  Component Value Date   HGBA1C 5.8 (H) 06/30/2022     How often do you need to have someone help you when you read instructions, pamphlets, or other written materials from your doctor or pharmacy?: 1 - Never  Interpreter Needed?: No  Information entered by :: Rojelio Blush LPN   Activities of Daily Living     07/05/2024     4:07 PM 06/23/2024    9:50 AM  In your present state of health, do you have any difficulty performing the following activities:  Hearing? 1 0  Comment Wears Hearing Aide   Vision? 0 0  Difficulty concentrating or making decisions? 0 0  Walking or climbing stairs? 0 0  Dressing or bathing? 0 0  Doing errands, shopping? 0 0  Preparing Food and eating ? N   Using the Toilet? N   In the past six months, have you accidently leaked urine? N   Do you have problems with loss of bowel control? N   Managing your Medications? N   Managing your Finances? N   Housekeeping or managing your Housekeeping? N     Patient Care Team: Daryl Setter, NP as PCP - General (Internal Medicine) Court Dorn PARAS, MD as PCP - Cardiology (Cardiology)   I have updated your Care Teams any recent Medical Services you may have received from other providers in the past year.     Assessment:   This is a routine wellness examination for Yael.  Hearing/Vision screen Hearing Screening - Comments:: Wears Hearing Aids Vision Screening - Comments:: Wears rx glasses - up to date with routine eye exams with  [facility]   Goals Addressed               This Visit's Progress     Remain active (pt-stated)         Depression Screen      07/05/2024    4:05 PM 06/23/2024    9:49 AM 07/03/2023    7:03 AM 06/09/2023    1:58 PM 03/10/2023    7:59 AM 10/19/2013    2:45 PM 06/27/2013   11:15 AM  PHQ 2/9 Scores  PHQ - 2 Score 0 0 0 0 0 2 3  PHQ- 9 Score 0 3   0 5 6    Fall Risk  07/05/2024    4:08 PM 06/23/2024    9:49 AM 07/03/2023    7:03 AM 05/12/2023    8:20 AM 03/10/2023    8:00 AM  Fall Risk   Falls in the past year? 0 0 0 0 0  Number falls in past yr: 0 0 0 0 0  Injury with Fall? 0 0 0 0 0  Risk for fall due to : No Fall Risks  History of fall(s);No Fall Risks No Fall Risks No Fall Risks  Follow up Falls evaluation completed Falls evaluation completed;Education provided Falls  evaluation completed Falls evaluation completed Falls evaluation completed    MEDICARE RISK AT HOME:   Medicare Risk at Home Any stairs in or around the home?: No If so, are there any without handrails?: No Home free of loose throw rugs in walkways, pet beds, electrical cords, etc?: Yes Adequate lighting in your home to reduce risk of falls?: Yes Life alert?: Yes Use of a cane, walker or w/c?: No Grab bars in the bathroom?: No Shower chair or bench in shower?: No Elevated toilet seat or a handicapped toilet?: No  TIMED UP AND GO:  Was the test performed?  No  Cognitive Function: 6CIT completed        07/05/2024    4:09 PM 06/09/2023    2:12 PM  6CIT Screen  What Year? 0 points 0 points  What month? 0 points 0 points  What time? 0 points 0 points  Count back from 20 0 points 0 points  Months in reverse 0 points 0 points  Repeat phrase 0 points 0 points  Total Score 0 points 0 points    Immunizations Immunization History  Administered Date(s) Administered   INFLUENZA, HIGH DOSE SEASONAL PF 05/30/2023, 06/16/2024   Influenza Split 07/20/2007, 08/15/2011   Influenza,inj,Quad PF,6+ Mos 05/30/2013, 07/06/2014   Influenza-Unspecified 05/30/2023   PNEUMOCOCCAL CONJUGATE-20 07/03/2023   Pfizer(Comirnaty)Fall Seasonal Vaccine 12 years and older 06/06/2022, 05/30/2023, 06/16/2023, 06/16/2024   Pneumococcal Polysaccharide-23 05/11/2013   Tdap 05/24/2014    Screening Tests Health Maintenance  Topic Date Due   Hepatitis C Screening  Never done   Zoster Vaccines- Shingrix (1 of 2) Never done   DTaP/Tdap/Td (2 - Td or Tdap) 05/24/2024   COVID-19 Vaccine (4 - Pfizer risk 2025-26 season) 12/15/2024   Medicare Annual Wellness (AWV)  07/05/2025   Pneumococcal Vaccine: 50+ Years  Completed   Influenza Vaccine  Completed   DEXA SCAN  Completed   Meningococcal B Vaccine  Aged Out   Mammogram  Discontinued   Colonoscopy  Discontinued    Health Maintenance Items  Addressed:   Additional Screening:  Vision Screening: Recommended annual ophthalmology exams for early detection of glaucoma and other disorders of the eye. Is the patient up to date with their annual eye exam?  Yes  Who is the provider or what is the name of the office in which the patient attends annual eye exams? [facility]  Dental Screening: Recommended annual dental exams for proper oral hygiene  Community Resource Referral / Chronic Care Management: CRR required this visit?  No   CCM required this visit?  No   Plan:    I have personally reviewed and noted the following in the patient's chart:   Medical and social history Use of alcohol, tobacco or illicit drugs  Current medications and supplements including opioid prescriptions. Patient is not currently taking opioid prescriptions. Functional ability and status Nutritional status Physical activity Advanced directives List of  other physicians Hospitalizations, surgeries, and ER visits in previous 12 months Vitals Screenings to include cognitive, depression, and falls Referrals and appointments  In addition, I have reviewed and discussed with patient certain preventive protocols, quality metrics, and best practice recommendations. A written personalized care plan for preventive services as well as general preventive health recommendations were provided to patient.   Rojelio LELON Blush, LPN   [id number]   After Visit Summary: (MyChart) Due to this being a telephonic visit, the after visit summary with patients personalized plan was offered to patient via MyChart   Notes: Nothing significant to report at this time.

## 2024-07-05 NOTE — Patient Instructions (Addendum)
 Kelly Burns,  Thank you for taking the time for your Medicare Wellness Visit. I appreciate your continued commitment to your health goals. Please review the care plan we discussed, and feel free to reach out if I can assist you further.  Medicare recommends these wellness visits once per year to help you and your care team stay ahead of potential health issues. These visits are designed to focus on prevention, allowing your provider to concentrate on managing your acute and chronic conditions during your regular appointments.  Please note that Annual Wellness Visits do not include a physical exam. Some assessments may be limited, especially if the visit was conducted virtually. If needed, we may recommend a separate in-person follow-up with your provider.  Ongoing Care Seeing your primary care provider every 3 to 6 months helps us  monitor your health and provide consistent, personalized care.   Referrals If a referral was made during today's visit and you haven't received any updates within two weeks, please contact the referred provider directly to check on the status.  Recommended Screenings:  Health Maintenance  Topic Date Due   Hepatitis C Screening  Never done   Zoster (Shingles) Vaccine (1 of 2) Never done   DTaP/Tdap/Td vaccine (2 - Td or Tdap) 05/24/2024   COVID-19 Vaccine (4 - Pfizer risk 2025-26 season) 12/15/2024   Medicare Annual Wellness Visit  07/05/2025   Pneumococcal Vaccine for age over 44  Completed   Flu Shot  Completed   DEXA scan (bone density measurement)  Completed   Meningitis B Vaccine  Aged Out   Breast Cancer Screening  Discontinued   Colon Cancer Screening  Discontinued       07/05/2024    4:09 PM  Advanced Directives  Does Patient Have a Medical Advance Directive? Yes  Type of Estate agent of [place];Living will  Copy of Healthcare Power of Attorney in Chart? No - copy requested   Advance Care Planning is important because  it: Ensures you receive medical care that aligns with your values, goals, and preferences. Provides guidance to your family and loved ones, reducing the emotional burden of decision-making during critical moments.  Vision: Annual vision screenings are recommended for early detection of glaucoma, cataracts, and diabetic retinopathy. These exams can also reveal signs of chronic conditions such as diabetes and high blood pressure.  Dental: Annual dental screenings help detect early signs of oral cancer, gum disease, and other conditions linked to overall health, including heart disease and diabetes.  Please see the attached documents for additional preventive care recommendations.

## 2024-07-06 ENCOUNTER — Ambulatory Visit (HOSPITAL_BASED_OUTPATIENT_CLINIC_OR_DEPARTMENT_OTHER)
Admission: RE | Admit: 2024-07-06 | Discharge: 2024-07-06 | Disposition: A | Discharge status: Home / Self Care | Source: Ambulatory Visit | Attending: Family | Admitting: Family

## 2024-07-06 ENCOUNTER — Ambulatory Visit: Admitting: Family

## 2024-07-06 VITALS — BP 125/55 | HR 85 | Temp 98.0°F | Resp 16 | Ht 70.0 in | Wt 206.2 lb

## 2024-07-06 DIAGNOSIS — E782 Mixed hyperlipidemia: Secondary | ICD-10-CM | POA: Diagnosis not present

## 2024-07-06 DIAGNOSIS — Z8701 Personal history of pneumonia (recurrent): Secondary | ICD-10-CM | POA: Insufficient documentation

## 2024-07-06 DIAGNOSIS — N898 Other specified noninflammatory disorders of vagina: Secondary | ICD-10-CM

## 2024-07-06 DIAGNOSIS — J309 Allergic rhinitis, unspecified: Secondary | ICD-10-CM | POA: Diagnosis not present

## 2024-07-06 DIAGNOSIS — R739 Hyperglycemia, unspecified: Secondary | ICD-10-CM | POA: Diagnosis not present

## 2024-07-06 DIAGNOSIS — M810 Age-related osteoporosis without current pathological fracture: Secondary | ICD-10-CM

## 2024-07-06 DIAGNOSIS — T7840XA Allergy, unspecified, initial encounter: Secondary | ICD-10-CM | POA: Diagnosis not present

## 2024-07-06 DIAGNOSIS — I480 Paroxysmal atrial fibrillation: Secondary | ICD-10-CM | POA: Diagnosis not present

## 2024-07-06 LAB — HEMOGLOBIN A1C: Hgb A1c MFr Bld: 6.2 % (ref 4.6–6.5)

## 2024-07-06 LAB — BASIC METABOLIC PANEL WITH GFR
BUN: 15 mg/dL (ref 6–23)
CO2: 28 meq/L (ref 19–32)
Calcium: 9.7 mg/dL (ref 8.4–10.5)
Chloride: 107 meq/L (ref 96–112)
Creatinine, Ser: 0.76 mg/dL (ref 0.40–1.20)
GFR: 75.88 mL/min (ref >60.00)
Glucose, Bld: 93 mg/dL (ref 70–99)
Potassium: 4.5 meq/L (ref 3.5–5.1)
Sodium: 142 meq/L (ref 135–145)

## 2024-07-06 MED ORDER — FLUTICASONE PROPIONATE 50 MCG/ACT NA SUSP: 2.0000 | Freq: Every day | NASAL | 5 refills | Status: AC

## 2024-07-06 NOTE — Assessment & Plan Note (Signed)
 Maintained on metoprolol  and Eliquis .

## 2024-07-06 NOTE — Assessment & Plan Note (Signed)
 Maintained on singulair  and flonase .

## 2024-07-06 NOTE — Patient Instructions (Signed)
 VISIT SUMMARY:  You came in for a follow-up on your urinary tract infection and pneumonia. Your symptoms have improved with the current treatments. We also discussed your ongoing management for atrial fibrillation, hyperlipidemia, osteoporosis, and allergic rhinitis.  YOUR PLAN:  URINARY TRACT INFECTION: You had a urinary tract infection that did not respond to the initial treatment but has improved with cefdinir. -Complete your current course of cefdinir.  PNEUMONIA: You were treated for pneumonia with Augmentin  and albuterol . Your symptoms have improved, but you still have a residual cough likely due to post-nasal drainage and irritants. -We will order a repeat chest x-ray to check if the pneumonia has fully resolved.  ATRIAL FIBRILLATION: Your atrial fibrillation is well-managed with Eliquis , which you take to prevent stroke. -Continue taking Eliquis  as prescribed for stroke prevention.  HYPERLIPIDEMIA: Your cholesterol levels are mostly under control with Crestor , though your triglycerides were slightly elevated at the last check. -Continue taking Crestor  as prescribed. -We will check your cholesterol levels as needed.  OSTEOPOROSIS: Your osteoporosis is managed with Fosamax  and calcium  supplements. You have switched to chewable calcium  tablets due to difficulty swallowing. -Continue taking Fosamax  weekly. -Continue taking chewable calcium  tablets.  ALLERGIC RHINITIS: You manage your allergies with Singulair , but your prescription has expired. -We will renew your Singulair  prescription.

## 2024-07-06 NOTE — Assessment & Plan Note (Signed)
 Stable/clinically resolved. Update CXR today.

## 2024-07-06 NOTE — Assessment & Plan Note (Signed)
 Lab Results  Component Value Date   CHOL 152 01/06/2024   HDL 45.10 01/06/2024   LDLCALC 66 01/06/2024   TRIG 205.0 (H) 01/06/2024   CHOLHDL 3 01/06/2024   LDL at goal, continue crestor .

## 2024-07-06 NOTE — Progress Notes (Signed)
 Subjective:     Patient ID: Kelly Burns, female    DOB: [birth year], 78 y.o.   MRN: [id number]  Chief Complaint  Patient presents with   Hyperlipidemia    Here for follow up   Pneumonia    Here for follow up    Hyperlipidemia  Pneumonia    Discussed the use of AI scribe software for clinical note transcription with the patient, who gave verbal consent to proceed.  History of Present Illness  Kelly Burns is a 78 year old female who presents for follow-up of urinary tract infection and pneumonia. She is completing a seven-day course of cefdinir for a urinary tract infection after initial treatment with nitrofurantoin was ineffective. Her symptoms have significantly improved. She experienced pneumonia, initially treated with Augmentin , and continued for an additional five days after a follow-up. She was also prescribed albuterol  and tessalon . Her cough has improved, and she has not used the inhaler recently, though some coughing persists, likely due to dust exposure and drainage. She takes Eliquis  for atrial fibrillation and stroke risk management, with no recent episodes. She requests a refill for Singulair , which she takes for allergies.      Health Maintenance Due  Topic Date Due   Hepatitis C Screening  Never done   Zoster Vaccines- Shingrix (1 of 2) Never done   DTaP/Tdap/Td (2 - Td or Tdap) 05/24/2024    Past Medical History:  Diagnosis Date   Adhesive capsulitis of left shoulder 05/11/2013   Arthritis    Cataract    Community acquired pneumonia of right lower lobe of lung 06/29/2022   Deafness in right ear    Diverticulitis    one episode   fibroadenoma right breast 05/11/2013   See U/S 03/2013   Hyperlipidemia    IBS (irritable bowel syndrome)    urgency   PAF (paroxysmal atrial fibrillation) (HCC)    Squamous cell skin cancer 11/04/2023   right arm skin cancer removal   TIA (transient ischemic attack)    Vertigo     Past Surgical History:   Procedure Laterality Date   APPENDECTOMY     BLADDER SUSPENSION  2020   BREAST BIOPSY Right    reports that it was a cyst   CHOLECYSTECTOMY     MIDDLE EAR SURGERY     skin cancer removal  2025   Right arm (? MOH's)   TONSILLECTOMY      Family History  Problem Relation Age of Onset   Heart disease Mother    Dementia Father    Cancer Maternal Grandmother        breast   Breast cancer Maternal Grandmother    Cancer Maternal Aunt        colon   Breast cancer Cousin     Social History   Socioeconomic History   Marital status: Widowed    Spouse name: Not on file   Number of children: 3   Years of education: Not on file   Highest education level: Some college, no degree  Occupational History   Not on file  Tobacco Use   Smoking status: Former    Current packs/day: 0.00    Types: Cigarettes    Quit date: 09/15/2002    Years since quitting: 21.8   Smokeless tobacco: Never  Vaping Use   Vaping status: Never Used  Substance and Sexual Activity   Alcohol use: Yes    Comment: social   Drug use: No   Sexual activity:  Not Currently  Other Topics Concern   Not on file  Social History Narrative   Lives at home alone    Widowed (husband died 08-10-2013)   Right handed   Caffeine: decaf coffee 0-2 cups/day   1 adopted son   1 biological son   1 biological daughter   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    Social Drivers of Corporate investment banker Strain: Low Risk  (07/05/2024)   Overall Financial Resource Strain (CARDIA)    Difficulty of Paying Living Expenses: Not hard at all  Food Insecurity: No Food Insecurity (07/05/2024)   Hunger Vital Sign    Worried About Running Out of Food in the Last Year: Never true    Ran Out of Food in the Last Year: Never true  Transportation Needs: No Transportation Needs (07/05/2024)   PRAPARE - Administrator, Civil Service (Medical): No    Lack of Transportation  (Non-Medical): No  Physical Activity: Inactive (07/05/2024)   Exercise Vital Sign    Days of Exercise per Week: 0 days    Minutes of Exercise per Session: 0 min  Stress: No Stress Concern Present (07/05/2024)   [employer] of Occupational Health - Occupational Stress Questionnaire    Feeling of Stress: Not at all  Social Connections: Moderately Integrated (07/05/2024)   Social Connection and Isolation Panel    Frequency of Communication with Friends and Family: More than three times a week    Frequency of Social Gatherings with Friends and Family: More than three times a week    Attends Religious Services: More than 4 times per year    Active Member of [employer] or Organizations: Yes    Attends Banker Meetings: More than 4 times per year    Marital Status: Widowed  Intimate Partner Violence: Not At Risk (07/05/2024)   Humiliation, Afraid, Rape, and Kick questionnaire    Fear of Current or Ex-Partner: No    Emotionally Abused: No    Physically Abused: No    Sexually Abused: No    Outpatient Medications Prior to Visit  Medication Sig Dispense Refill   albuterol  (VENTOLIN  HFA) 108 (90 Base) MCG/ACT inhaler Inhale 2 puffs into the lungs every 6 (six) hours as needed for wheezing or shortness of breath. 8 g 0   alendronate  (FOSAMAX ) 70 MG tablet Take 1 tablet (70 mg total) by mouth every 7 (seven) days. Take with a full glass of water on an empty stomach. 4 tablet 11   apixaban  (ELIQUIS ) 5 MG TABS tablet Take 1 tablet by mouth twice daily 60 tablet 5   calcium -vitamin D  (OSCAL WITH D) 500-5 MG-MCG tablet Take 1 tablet by mouth daily with breakfast.     metoprolol  tartrate (LOPRESSOR ) 25 MG tablet Take 1 tablet (25 mg total) by mouth as needed for up to 1 dose (MAY TAKE UP TO TWICE-A-DAY AS NEEDED FOR SUSTAINED PALP LASTING > 10 MIN.). 30 tablet 1   montelukast  (SINGULAIR ) 10 MG tablet TAKE 1 TABLET BY MOUTH AT BEDTIME 90 tablet 0   Multiple Vitamin (MULTIVITAMIN) tablet  Take 1 tablet by mouth daily.     PREMARIN vaginal cream Place 1 g vaginally once a week.     Probiotic Product (ALIGN PO) Take 1 capsule by mouth every other day.     rosuvastatin  (CRESTOR ) 10 MG tablet Take 1 tablet by mouth once daily 90 tablet 0   ciprofloxacin  (CIPRO )  500 MG tablet Take 500 mg by mouth 2 (two) times daily.     fluconazole (DIFLUCAN) 150 MG tablet Take 1 tablet (150 mg total) by mouth daily. May repeat in 3 days if needed. 2 tablet 0   fluticasone  (FLONASE  ALLERGY RELIEF) 50 MCG/ACT nasal spray Place 2 sprays into both nostrils daily. 16 g 5   azithromycin  (ZITHROMAX ) 250 MG tablet Take 1 tablet (250 mg total) by mouth daily. Take first 2 tablets together, then 1 every day until finished. (Patient not taking: Reported on 06/23/2024) 6 tablet 0   benzonatate  (TESSALON ) 100 MG capsule Take 1 capsule (100 mg total) by mouth 3 (three) times daily as needed. (Patient not taking: Reported on 06/23/2024) 20 capsule 0   loratadine  (CLARITIN ) 10 MG tablet Take 1 tablet (10 mg total) by mouth daily. (Patient not taking: Reported on 06/23/2024) 90 tablet 4   predniSONE  (DELTASONE ) 10 MG tablet 4 tabs by mouth once daily for 2 days, then 3 tabs daily x 2 days, then 2 tabs daily x 2 days, then 1 tab daily x 2 days (Patient not taking: Reported on 06/23/2024) 20 tablet 0   No facility-administered medications prior to visit.    Allergies  Allergen Reactions   Bactrim [Sulfamethoxazole-Trimethoprim] Rash    Rash broke out on stomach   Other Itching and Rash    CSF-HC powder applied to the mastoid cavity results in severe itching and ear drainage.    ROS See HPI    Objective:    Physical Exam Constitutional:      Appearance: She is well-developed.  Cardiovascular:     Rate and Rhythm: Normal rate and regular rhythm.     Heart sounds: Normal heart sounds. No murmur heard. Pulmonary:     Effort: Pulmonary effort is normal. No respiratory distress.     Breath sounds: Normal breath  sounds. No wheezing.  Psychiatric:        Behavior: Behavior normal.        Thought Content: Thought content normal.        Judgment: Judgment normal.      BP (!) 125/55 (BP Location: Right Arm, Patient Position: Sitting, Cuff Size: Large)   Pulse 85   Temp 98 F (36.7 C) (Oral)   Resp 16   Ht 5' 10 (1.778 m)   Wt 206 lb 3.2 oz (93.5 kg)   SpO2 96%   BMI 29.59 kg/m  Wt Readings from Last 3 Encounters:  07/06/24 206 lb 3.2 oz (93.5 kg)  07/05/24 206 lb (93.4 kg)  06/23/24 206 lb (93.4 kg)       Assessment & Plan:   Problem List Items Addressed This Visit       Unprioritized   Vaginal dryness   This is being treated with estrace cream.       PAF (paroxysmal atrial fibrillation) (HCC)   Maintained on metoprolol  and Eliquis .       Osteoporosis   Continues fosamax  and calcium .  Bone density test is up to date.       Hyperlipidemia   Lab Results  Component Value Date   CHOL 152 01/06/2024   HDL 45.10 01/06/2024   LDLCALC 66 01/06/2024   TRIG 205.0 (H) 01/06/2024   CHOLHDL 3 01/06/2024   LDL at goal, continue crestor .       History of community acquired pneumonia - Primary   Stable/clinically resolved. Update CXR today.       Relevant Orders   DG Chest  2 View   Allergic rhinitis   Maintained on singulair  and flonase .       Allergic   Relevant Medications   fluticasone  (FLONASE  ALLERGY RELIEF) 50 MCG/ACT nasal spray   Other Visit Diagnoses       Hyperglycemia       Relevant Orders   Basic Metabolic Panel (BMET)   HgB A1c        I have discontinued Glendale B. Digman's loratadine , azithromycin , predniSONE , benzonatate , ciprofloxacin , and fluconazole. I am also having her maintain her Probiotic Product (ALIGN PO), multivitamin, Premarin, calcium -vitamin D , alendronate , metoprolol  tartrate, Eliquis , albuterol , montelukast , rosuvastatin , and fluticasone .  Meds ordered this encounter  Medications   fluticasone  (FLONASE  ALLERGY RELIEF) 50 MCG/ACT  nasal spray    Sig: Place 2 sprays into both nostrils daily.    Dispense:  16 g    Refill:  5    Supervising Provider:   DOMENICA BLACKBIRD A [[id number]]

## 2024-07-06 NOTE — Assessment & Plan Note (Addendum)
 Continues fosamax  and calcium .  Bone density test is up to date.

## 2024-07-06 NOTE — Assessment & Plan Note (Signed)
 This is being treated with estrace cream.

## 2024-07-07 ENCOUNTER — Encounter: Payer: Self-pay | Admitting: Family

## 2024-07-07 ENCOUNTER — Ambulatory Visit: Payer: Self-pay | Admitting: Family

## 2024-07-07 DIAGNOSIS — R739 Hyperglycemia, unspecified: Secondary | ICD-10-CM | POA: Insufficient documentation

## 2024-08-09 ENCOUNTER — Other Ambulatory Visit (HOSPITAL_BASED_OUTPATIENT_CLINIC_OR_DEPARTMENT_OTHER): Payer: Self-pay | Admitting: Family

## 2024-08-09 DIAGNOSIS — Z1231 Encounter for screening mammogram for malignant neoplasm of breast: Secondary | ICD-10-CM

## 2024-09-23 ENCOUNTER — Other Ambulatory Visit: Payer: Self-pay | Admitting: Family

## 2024-09-23 DIAGNOSIS — M810 Age-related osteoporosis without current pathological fracture: Secondary | ICD-10-CM

## 2024-09-24 ENCOUNTER — Other Ambulatory Visit: Payer: Self-pay | Admitting: Family

## 2024-09-24 DIAGNOSIS — I480 Paroxysmal atrial fibrillation: Secondary | ICD-10-CM

## 2024-09-26 ENCOUNTER — Ambulatory Visit (HOSPITAL_BASED_OUTPATIENT_CLINIC_OR_DEPARTMENT_OTHER)
Admission: RE | Admit: 2024-09-26 | Discharge: 2024-09-26 | Disposition: A | Discharge status: Home / Self Care | Source: Ambulatory Visit | Attending: Family | Admitting: Family

## 2024-09-26 ENCOUNTER — Encounter (HOSPITAL_BASED_OUTPATIENT_CLINIC_OR_DEPARTMENT_OTHER): Payer: Self-pay

## 2024-09-26 DIAGNOSIS — Z1231 Encounter for screening mammogram for malignant neoplasm of breast: Secondary | ICD-10-CM | POA: Diagnosis present

## 2024-09-29 ENCOUNTER — Ambulatory Visit: Payer: Self-pay | Admitting: Family

## 2024-09-29 ENCOUNTER — Other Ambulatory Visit: Payer: Self-pay | Admitting: Family

## 2024-09-29 DIAGNOSIS — E782 Mixed hyperlipidemia: Secondary | ICD-10-CM

## 2025-01-04 ENCOUNTER — Ambulatory Visit: Admitting: Family

## 2025-01-16 ENCOUNTER — Ambulatory Visit: Admitting: Student

## 2025-07-11 ENCOUNTER — Ambulatory Visit
